# Patient Record
Sex: Female | Born: 1950 | Race: White | Hispanic: No | Marital: Married | Smoking: Former smoker
Health system: Southern US, Community
[De-identification: ages and names within clinical notes are randomized; demographics above are authoritative.]

## PROBLEM LIST (undated history)

## (undated) DIAGNOSIS — S82032A Displaced transverse fracture of left patella, initial encounter for closed fracture: Secondary | ICD-10-CM

## (undated) DIAGNOSIS — N823 Fistula of vagina to large intestine: Secondary | ICD-10-CM

## (undated) DIAGNOSIS — S52501A Unspecified fracture of the lower end of right radius, initial encounter for closed fracture: Secondary | ICD-10-CM

## (undated) DIAGNOSIS — M1990 Unspecified osteoarthritis, unspecified site: Secondary | ICD-10-CM

## (undated) DIAGNOSIS — D649 Anemia, unspecified: Secondary | ICD-10-CM

## (undated) DIAGNOSIS — K56609 Unspecified intestinal obstruction, unspecified as to partial versus complete obstruction: Secondary | ICD-10-CM

## (undated) DIAGNOSIS — K632 Fistula of intestine: Secondary | ICD-10-CM

## (undated) DIAGNOSIS — L03319 Cellulitis of trunk, unspecified: Secondary | ICD-10-CM

## (undated) DIAGNOSIS — Z5189 Encounter for other specified aftercare: Secondary | ICD-10-CM

## (undated) DIAGNOSIS — L02219 Cutaneous abscess of trunk, unspecified: Secondary | ICD-10-CM

## (undated) DIAGNOSIS — K56699 Other intestinal obstruction unspecified as to partial versus complete obstruction: Secondary | ICD-10-CM

## (undated) DIAGNOSIS — K50913 Crohn's disease, unspecified, with fistula: Secondary | ICD-10-CM

## (undated) DIAGNOSIS — K649 Unspecified hemorrhoids: Secondary | ICD-10-CM

## (undated) DIAGNOSIS — M810 Age-related osteoporosis without current pathological fracture: Secondary | ICD-10-CM

## (undated) DIAGNOSIS — K435 Parastomal hernia without obstruction or  gangrene: Secondary | ICD-10-CM

## (undated) DIAGNOSIS — N321 Vesicointestinal fistula: Secondary | ICD-10-CM

## (undated) DIAGNOSIS — K624 Stenosis of anus and rectum: Secondary | ICD-10-CM

## (undated) DIAGNOSIS — K50113 Crohn's disease of large intestine with fistula: Secondary | ICD-10-CM

## (undated) HISTORY — DX: Unspecified intestinal obstruction, unspecified as to partial versus complete obstruction: K56.609

## (undated) HISTORY — DX: Unspecified hemorrhoids: K64.9

## (undated) HISTORY — PX: BLADDER REPAIR: SHX76

## (undated) HISTORY — DX: Encounter for other specified aftercare: Z51.89

## (undated) HISTORY — PX: COLONOSCOPY: SHX174

## (undated) HISTORY — PX: TUBAL LIGATION: SHX77

## (undated) HISTORY — DX: Fistula of intestine: K63.2

## (undated) HISTORY — DX: Unspecified osteoarthritis, unspecified site: M19.90

## (undated) HISTORY — PX: APPENDECTOMY: SHX54

## (undated) HISTORY — DX: Crohn's disease, unspecified, with fistula: K50.913

## (undated) HISTORY — DX: Anemia, unspecified: D64.9

## (undated) HISTORY — DX: Parastomal hernia without obstruction or gangrene: K43.5

## (undated) HISTORY — DX: Age-related osteoporosis without current pathological fracture: M81.0

---
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
## 2000-11-28 ENCOUNTER — Encounter: Payer: Self-pay | Admitting: General Surgery

## 2000-11-28 ENCOUNTER — Encounter: Admission: RE | Admit: 2000-11-28 | Discharge: 2000-11-28 | Payer: Self-pay | Admitting: General Surgery

## 2000-12-18 ENCOUNTER — Encounter: Payer: Self-pay | Admitting: General Surgery

## 2000-12-18 ENCOUNTER — Encounter: Admission: RE | Admit: 2000-12-18 | Discharge: 2000-12-18 | Payer: Self-pay | Admitting: General Surgery

## 2000-12-26 ENCOUNTER — Other Ambulatory Visit: Admission: RE | Admit: 2000-12-26 | Discharge: 2000-12-26 | Payer: Self-pay | Admitting: *Deleted

## 2000-12-31 ENCOUNTER — Encounter: Payer: Self-pay | Admitting: *Deleted

## 2000-12-31 ENCOUNTER — Encounter: Admission: RE | Admit: 2000-12-31 | Discharge: 2000-12-31 | Payer: Self-pay | Admitting: *Deleted

## 2001-02-25 ENCOUNTER — Ambulatory Visit (HOSPITAL_COMMUNITY): Admission: RE | Admit: 2001-02-25 | Discharge: 2001-02-25 | Payer: Self-pay | Admitting: Gastroenterology

## 2001-02-25 ENCOUNTER — Encounter: Payer: Self-pay | Admitting: Gastroenterology

## 2001-03-11 ENCOUNTER — Encounter (INDEPENDENT_AMBULATORY_CARE_PROVIDER_SITE_OTHER): Payer: Self-pay

## 2001-03-11 ENCOUNTER — Other Ambulatory Visit: Admission: RE | Admit: 2001-03-11 | Discharge: 2001-03-11 | Payer: Self-pay | Admitting: Gastroenterology

## 2002-04-01 ENCOUNTER — Other Ambulatory Visit: Admission: RE | Admit: 2002-04-01 | Discharge: 2002-04-01 | Payer: Self-pay | Admitting: Obstetrics and Gynecology

## 2003-04-06 ENCOUNTER — Other Ambulatory Visit: Admission: RE | Admit: 2003-04-06 | Discharge: 2003-04-06 | Payer: Self-pay | Admitting: Obstetrics and Gynecology

## 2003-06-15 ENCOUNTER — Encounter: Admission: RE | Admit: 2003-06-15 | Discharge: 2003-06-15 | Payer: Self-pay | Admitting: Obstetrics and Gynecology

## 2004-06-07 ENCOUNTER — Ambulatory Visit: Payer: Self-pay | Admitting: Gastroenterology

## 2004-06-21 ENCOUNTER — Ambulatory Visit (HOSPITAL_COMMUNITY): Admission: RE | Admit: 2004-06-21 | Discharge: 2004-06-21 | Payer: Self-pay | Admitting: Gastroenterology

## 2004-06-28 ENCOUNTER — Ambulatory Visit: Payer: Self-pay | Admitting: Gastroenterology

## 2004-07-05 ENCOUNTER — Ambulatory Visit: Payer: Self-pay | Admitting: Gastroenterology

## 2004-07-12 ENCOUNTER — Ambulatory Visit: Payer: Self-pay | Admitting: Gastroenterology

## 2006-07-05 ENCOUNTER — Ambulatory Visit: Payer: Self-pay | Admitting: Gastroenterology

## 2006-07-05 LAB — CONVERTED CEMR LAB
AST: 23 units/L (ref 0–37)
Albumin: 3.3 g/dL — ABNORMAL LOW (ref 3.5–5.2)
Potassium: 3.8 meq/L (ref 3.5–5.1)
Rhuematoid fact SerPl-aCnc: <20 intl units/mL — ABNORMAL LOW (ref 0.0–20.0)
T4, Total: 6.8 ug/dL (ref 5.0–12.5)
Total Protein: 7.6 g/dL (ref 6.0–8.3)

## 2006-07-16 ENCOUNTER — Encounter: Payer: Self-pay | Admitting: Gastroenterology

## 2006-07-16 ENCOUNTER — Ambulatory Visit: Payer: Self-pay | Admitting: Gastroenterology

## 2006-07-18 ENCOUNTER — Ambulatory Visit: Payer: Self-pay | Admitting: Gastroenterology

## 2006-07-25 ENCOUNTER — Ambulatory Visit: Payer: Self-pay | Admitting: Gastroenterology

## 2006-08-02 ENCOUNTER — Ambulatory Visit: Payer: Self-pay | Admitting: Gastroenterology

## 2006-09-11 ENCOUNTER — Ambulatory Visit: Payer: Self-pay | Admitting: Gastroenterology

## 2006-09-11 LAB — CONVERTED CEMR LAB
HCT: 33.5 % — ABNORMAL LOW (ref 36.0–46.0)
Hemoglobin: 11.5 g/dL — ABNORMAL LOW (ref 12.0–15.0)
MCHC: 34.5 g/dL (ref 30.0–36.0)
MCV: 90.6 fL (ref 78.0–100.0)
Monocytes Relative: 12.2 % — ABNORMAL HIGH (ref 3.0–11.0)
Neutro Abs: 1.5 10*3/uL (ref 1.4–7.7)
Platelets: 328 10*3/uL (ref 150–400)
RBC: 3.69 M/uL — ABNORMAL LOW (ref 3.87–5.11)
Sed Rate: 29 mm/hr — ABNORMAL HIGH (ref 0–25)
WBC: 4.7 10*3/uL (ref 4.5–10.5)

## 2006-12-11 ENCOUNTER — Ambulatory Visit: Payer: Self-pay | Admitting: Gastroenterology

## 2006-12-11 LAB — CONVERTED CEMR LAB
BUN: 12 mg/dL (ref 6–23)
Calcium: 9.3 mg/dL (ref 8.4–10.5)
Chloride: 103 meq/L (ref 96–112)
GFR calc non Af Amer: 110 mL/min
HCT: 33.9 % — ABNORMAL LOW (ref 36.0–46.0)
Hemoglobin: 11.7 g/dL — ABNORMAL LOW (ref 12.0–15.0)
MCHC: 34.7 g/dL (ref 30.0–36.0)
Neutro Abs: 2.1 10*3/uL (ref 1.4–7.7)
Neutrophils Relative %: 40.5 % — ABNORMAL LOW (ref 43.0–77.0)
RBC: 3.75 M/uL — ABNORMAL LOW (ref 3.87–5.11)
Sed Rate: 55 mm/hr — ABNORMAL HIGH (ref 0–25)
Sodium: 137 meq/L (ref 135–145)
Vitamin B-12: 265 pg/mL (ref 211–911)

## 2006-12-13 ENCOUNTER — Encounter: Admission: RE | Admit: 2006-12-13 | Discharge: 2006-12-13 | Payer: Self-pay | Admitting: Obstetrics and Gynecology

## 2007-02-11 ENCOUNTER — Ambulatory Visit: Payer: Self-pay | Admitting: Surgery

## 2007-02-26 ENCOUNTER — Ambulatory Visit: Payer: Self-pay | Admitting: Gastroenterology

## 2007-02-26 LAB — CONVERTED CEMR LAB: Creatinine, Ser: 0.5 mg/dL (ref 0.4–1.2)

## 2007-02-27 ENCOUNTER — Other Ambulatory Visit: Payer: Self-pay | Admitting: Gastroenterology

## 2007-02-27 ENCOUNTER — Inpatient Hospital Stay (HOSPITAL_COMMUNITY): Admission: AD | Admit: 2007-02-27 | Discharge: 2007-03-11 | Payer: Self-pay | Admitting: Gastroenterology

## 2007-02-27 ENCOUNTER — Ambulatory Visit: Payer: Self-pay | Admitting: Cardiovascular Disease

## 2007-03-05 ENCOUNTER — Ambulatory Visit: Payer: Self-pay | Admitting: Internal Medicine

## 2007-03-06 ENCOUNTER — Encounter: Payer: Self-pay | Admitting: Internal Medicine

## 2007-03-07 ENCOUNTER — Ambulatory Visit: Payer: Self-pay | Admitting: Internal Medicine

## 2007-03-14 ENCOUNTER — Ambulatory Visit: Payer: Self-pay | Admitting: Gastroenterology

## 2007-03-21 ENCOUNTER — Ambulatory Visit (HOSPITAL_COMMUNITY): Admission: RE | Admit: 2007-03-21 | Discharge: 2007-03-21 | Payer: Self-pay | Admitting: Gastroenterology

## 2007-03-25 ENCOUNTER — Ambulatory Visit: Payer: Self-pay | Admitting: Gastroenterology

## 2007-03-25 LAB — CONVERTED CEMR LAB
Basophils Relative: 1 % (ref 0.0–1.0)
Eosinophils Absolute: 0.3 10*3/uL (ref 0.0–0.6)
Eosinophils Relative: 6.5 % — ABNORMAL HIGH (ref 0.0–5.0)
Lymphocytes Relative: 45.1 % (ref 12.0–46.0)
Monocytes Relative: 12.6 % — ABNORMAL HIGH (ref 3.0–11.0)
Neutrophils Relative %: 34.8 % — ABNORMAL LOW (ref 43.0–77.0)
Sed Rate: 49 mm/hr — ABNORMAL HIGH (ref 0–25)
WBC: 4.6 10*3/uL (ref 4.5–10.5)

## 2007-04-05 ENCOUNTER — Ambulatory Visit: Payer: Self-pay | Admitting: Internal Medicine

## 2007-05-29 ENCOUNTER — Ambulatory Visit: Payer: Self-pay | Admitting: Gastroenterology

## 2007-06-05 ENCOUNTER — Ambulatory Visit: Payer: Self-pay | Admitting: Cardiovascular Disease

## 2007-06-07 ENCOUNTER — Ambulatory Visit: Payer: Self-pay | Admitting: Gastroenterology

## 2007-06-07 LAB — CONVERTED CEMR LAB
CRP, High Sensitivity: 7 — ABNORMAL HIGH (ref 0.00–5.00)
Eosinophils Absolute: 0.1 10*3/uL (ref 0.0–0.6)
Eosinophils Relative: 2.5 % (ref 0.0–5.0)
HCT: 37.6 % (ref 36.0–46.0)
MCHC: 35.1 g/dL (ref 30.0–36.0)
Monocytes Relative: 12.3 % — ABNORMAL HIGH (ref 3.0–11.0)
Neutro Abs: 2.9 10*3/uL (ref 1.4–7.7)
Neutrophils Relative %: 53.4 % (ref 43.0–77.0)
RBC: 4.07 M/uL (ref 3.87–5.11)
Sed Rate: 60 mm/hr — ABNORMAL HIGH (ref 0–25)

## 2007-07-02 DIAGNOSIS — K649 Unspecified hemorrhoids: Secondary | ICD-10-CM | POA: Insufficient documentation

## 2007-07-02 DIAGNOSIS — K5090 Crohn's disease, unspecified, without complications: Secondary | ICD-10-CM

## 2007-07-02 DIAGNOSIS — K632 Fistula of intestine: Secondary | ICD-10-CM | POA: Insufficient documentation

## 2007-07-02 DIAGNOSIS — M810 Age-related osteoporosis without current pathological fracture: Secondary | ICD-10-CM | POA: Insufficient documentation

## 2007-07-02 DIAGNOSIS — K56609 Unspecified intestinal obstruction, unspecified as to partial versus complete obstruction: Secondary | ICD-10-CM | POA: Insufficient documentation

## 2007-09-24 ENCOUNTER — Ambulatory Visit: Payer: Self-pay | Admitting: Gastroenterology

## 2007-09-24 ENCOUNTER — Telehealth: Payer: Self-pay | Admitting: Gastroenterology

## 2007-09-24 LAB — CONVERTED CEMR LAB
BUN: 8 mg/dL (ref 6–23)
Basophils Absolute: 0.1 10*3/uL (ref 0.0–0.1)
Basophils Relative: 1.8 % — ABNORMAL HIGH (ref 0.0–1.0)
CO2: 30 meq/L (ref 19–32)
CRP, High Sensitivity: 4 (ref 0.00–5.00)
Chloride: 106 meq/L (ref 96–112)
Creatinine, Ser: 0.6 mg/dL (ref 0.4–1.2)
Eosinophils Relative: 3.2 % (ref 0.0–5.0)
Glucose, Bld: 90 mg/dL (ref 70–99)
HCT: 35.7 % — ABNORMAL LOW (ref 36.0–46.0)
MCHC: 34.3 g/dL (ref 30.0–36.0)
Potassium: 3.7 meq/L (ref 3.5–5.1)
RDW: 12.8 % (ref 11.5–14.6)
Sed Rate: 53 mm/hr — ABNORMAL HIGH (ref 0–22)
Sodium: 141 meq/L (ref 135–145)
WBC: 4.8 10*3/uL (ref 4.5–10.5)

## 2007-09-27 ENCOUNTER — Ambulatory Visit: Payer: Self-pay | Admitting: Cardiology

## 2007-10-03 ENCOUNTER — Telehealth: Payer: Self-pay | Admitting: Gastroenterology

## 2007-10-03 ENCOUNTER — Encounter: Payer: Self-pay | Admitting: Gastroenterology

## 2008-06-05 ENCOUNTER — Telehealth: Payer: Self-pay | Admitting: Gastroenterology

## 2008-07-28 ENCOUNTER — Ambulatory Visit: Payer: Self-pay | Admitting: Gastroenterology

## 2008-07-28 ENCOUNTER — Telehealth: Payer: Self-pay | Admitting: Gastroenterology

## 2008-07-29 ENCOUNTER — Telehealth: Payer: Self-pay | Admitting: Internal Medicine

## 2008-07-29 ENCOUNTER — Ambulatory Visit: Payer: Self-pay | Admitting: Cardiology

## 2008-07-29 LAB — CONVERTED CEMR LAB
BUN: 6 mg/dL (ref 6–23)
Basophils Relative: 0.4 % (ref 0.0–3.0)
Calcium: 9.2 mg/dL (ref 8.4–10.5)
Eosinophils Relative: 0.4 % (ref 0.0–5.0)
GFR calc non Af Amer: 108.92 mL/min (ref >60)
HCT: 35.3 % — ABNORMAL LOW (ref 36.0–46.0)
Lymphocytes Relative: 20.6 % (ref 12.0–46.0)
Platelets: 255 10*3/uL (ref 150.0–400.0)
RDW: 11.7 % (ref 11.5–14.6)

## 2008-07-30 ENCOUNTER — Ambulatory Visit: Payer: Self-pay | Admitting: Gastroenterology

## 2008-08-03 ENCOUNTER — Encounter: Payer: Self-pay | Admitting: Gastroenterology

## 2008-08-03 ENCOUNTER — Telehealth: Payer: Self-pay | Admitting: Gastroenterology

## 2008-08-04 ENCOUNTER — Encounter (INDEPENDENT_AMBULATORY_CARE_PROVIDER_SITE_OTHER): Payer: Self-pay | Admitting: *Deleted

## 2008-08-04 ENCOUNTER — Ambulatory Visit (HOSPITAL_COMMUNITY): Admission: RE | Admit: 2008-08-04 | Discharge: 2008-08-04 | Payer: Self-pay | Admitting: Obstetrics and Gynecology

## 2008-08-06 ENCOUNTER — Telehealth: Payer: Self-pay | Admitting: Gastroenterology

## 2008-08-10 ENCOUNTER — Ambulatory Visit: Payer: Self-pay | Admitting: Gastroenterology

## 2008-08-11 ENCOUNTER — Ambulatory Visit: Payer: Self-pay | Admitting: Gastroenterology

## 2008-08-11 DIAGNOSIS — L02219 Cutaneous abscess of trunk, unspecified: Secondary | ICD-10-CM

## 2008-08-11 DIAGNOSIS — L03319 Cellulitis of trunk, unspecified: Secondary | ICD-10-CM

## 2008-08-11 HISTORY — DX: Cutaneous abscess of trunk, unspecified: L02.219

## 2008-08-11 LAB — CONVERTED CEMR LAB
Basophils Relative: 0.6 % (ref 0.0–3.0)
CRP, High Sensitivity: 26 — ABNORMAL HIGH (ref 0.00–5.00)
Eosinophils Relative: 0.6 % (ref 0.0–5.0)
Hemoglobin: 12.4 g/dL (ref 12.0–15.0)
Lymphs Abs: 1.4 10*3/uL (ref 0.7–4.0)
Monocytes Absolute: 0.7 10*3/uL (ref 0.1–1.0)
Monocytes Relative: 16.8 % — ABNORMAL HIGH (ref 3.0–12.0)
Neutrophils Relative %: 49.7 % (ref 43.0–77.0)
RBC: 3.68 M/uL — ABNORMAL LOW (ref 3.87–5.11)
RDW: 12.1 % (ref 11.5–14.6)
Sed Rate: 67 mm/hr — ABNORMAL HIGH (ref 0–22)

## 2008-08-26 ENCOUNTER — Ambulatory Visit: Payer: Self-pay | Admitting: Gastroenterology

## 2008-09-01 ENCOUNTER — Telehealth: Payer: Self-pay | Admitting: Gastroenterology

## 2008-09-10 ENCOUNTER — Ambulatory Visit: Payer: Self-pay | Admitting: Gastroenterology

## 2008-09-10 LAB — CONVERTED CEMR LAB
Basophils Relative: 1.6 % (ref 0.0–3.0)
Eosinophils Absolute: 0.1 10*3/uL (ref 0.0–0.7)
Eosinophils Relative: 3.5 % (ref 0.0–5.0)
Lymphs Abs: 1.7 10*3/uL (ref 0.7–4.0)
Monocytes Absolute: 0.4 10*3/uL (ref 0.1–1.0)
Monocytes Relative: 11.5 % (ref 3.0–12.0)
Neutrophils Relative %: 37.4 % — ABNORMAL LOW (ref 43.0–77.0)
RBC: 3.67 M/uL — ABNORMAL LOW (ref 3.87–5.11)
RDW: 12.5 % (ref 11.5–14.6)
Sed Rate: 58 mm/hr — ABNORMAL HIGH (ref 0–22)
WBC: 3.6 10*3/uL — ABNORMAL LOW (ref 4.5–10.5)

## 2008-09-11 LAB — CONVERTED CEMR LAB
Basophils Absolute: 0 10*3/uL (ref 0.0–0.1)
Basophils Relative: 1.3 % (ref 0.0–3.0)
Eosinophils Absolute: 0.1 10*3/uL (ref 0.0–0.7)
Eosinophils Relative: 3.8 % (ref 0.0–5.0)
Ferritin: 115.1 ng/mL (ref 10.0–291.0)
HCT: 35.2 % — ABNORMAL LOW (ref 36.0–46.0)
MCHC: 35.3 g/dL (ref 30.0–36.0)
Monocytes Absolute: 0.4 10*3/uL (ref 0.1–1.0)
Monocytes Relative: 12.2 % — ABNORMAL HIGH (ref 3.0–12.0)
Neutro Abs: 1.4 10*3/uL (ref 1.4–7.7)
Neutrophils Relative %: 41.1 % — ABNORMAL LOW (ref 43.0–77.0)
Platelets: 250 10*3/uL (ref 150.0–400.0)

## 2008-10-30 ENCOUNTER — Telehealth: Payer: Self-pay | Admitting: Gastroenterology

## 2008-11-03 IMAGING — CT CT PELVIS W/ CM
2 of 5 series · 16 of 46 positions shown, 18 images · IV contrast (omnipaque)
Comparison: 06/21/04.

CLINICAL DATA: Abdominal pain with history of Crohn?s disease.  Epigastric and right upper quadrant pain with nausea, vomiting, and fever.  
ABDOMEN CT WITH CONTRAST:
TECHNIQUE: Multidetector CT imaging of the abdomen was performed following the standard protocol during bolus administration of intravenous contrast.
Contrast:  100 cc Omnipaque 300
TECHNIQUE: Multidetector CT imaging of the pelvis was performed following the standard protocol during bolus administration of intravenous contrast.

[Series 2: abd_pel 5.0 b30f st · axial · 0.73mm/px · z∈[-410,-40]mm · 13 of 84 slices shown, 15 images]
[im 5/84  soft-tissue]
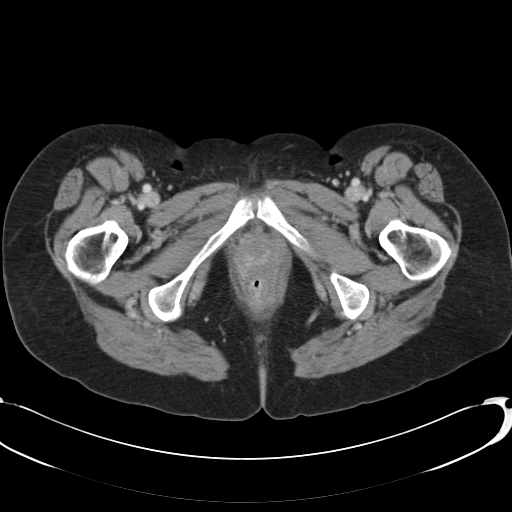
[im 5/84  bone]
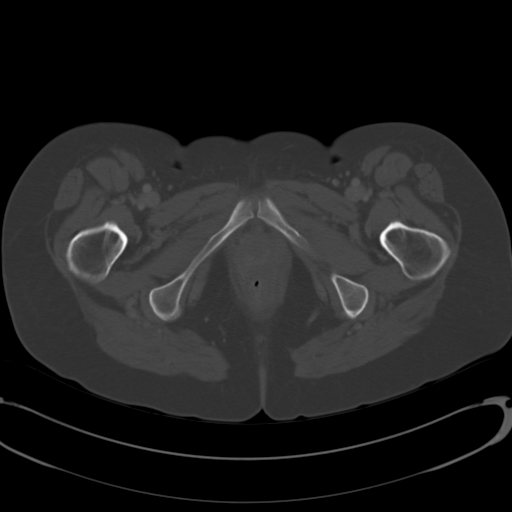
[im 10/84  soft-tissue]
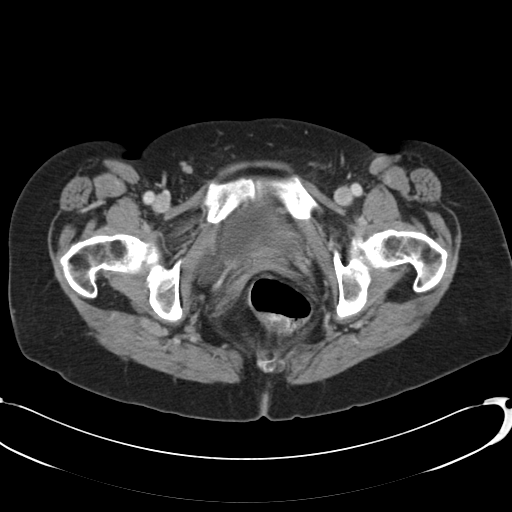
[im 19/84  soft-tissue]
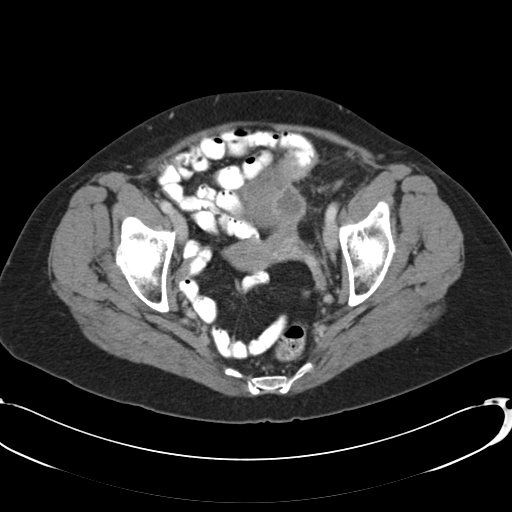
[im 24/84  soft-tissue]
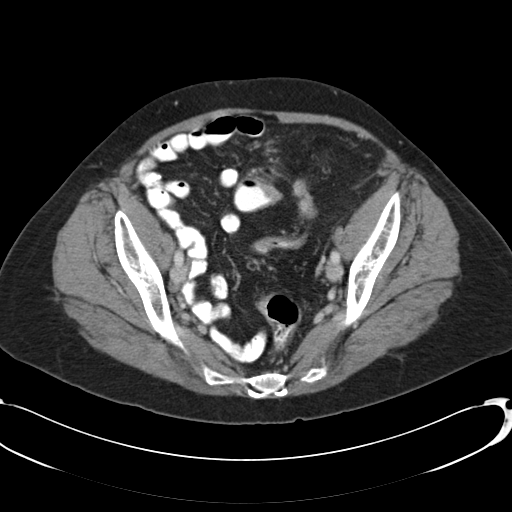
[im 28/84  soft-tissue]
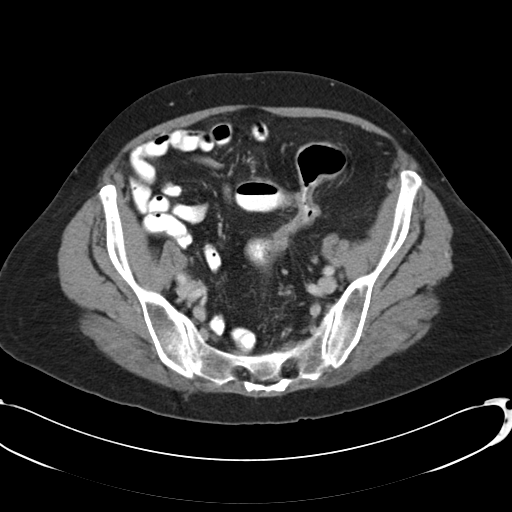
[im 37/84  soft-tissue]
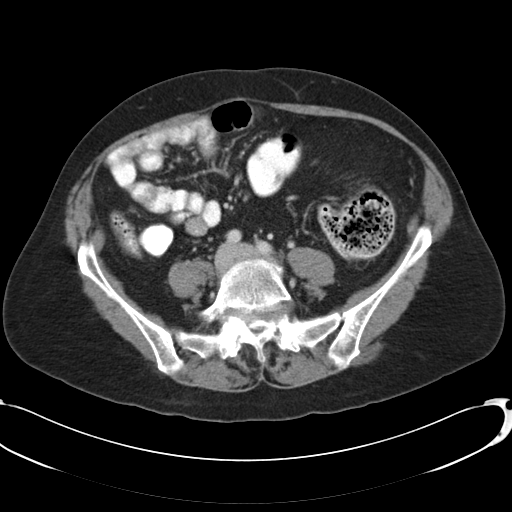
[im 42/84  soft-tissue]
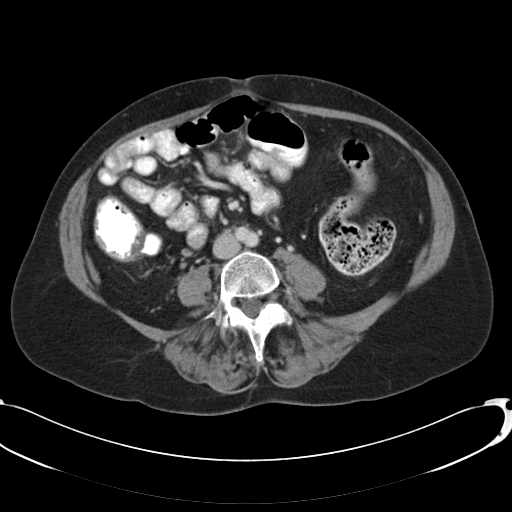
[im 47/84  soft-tissue]
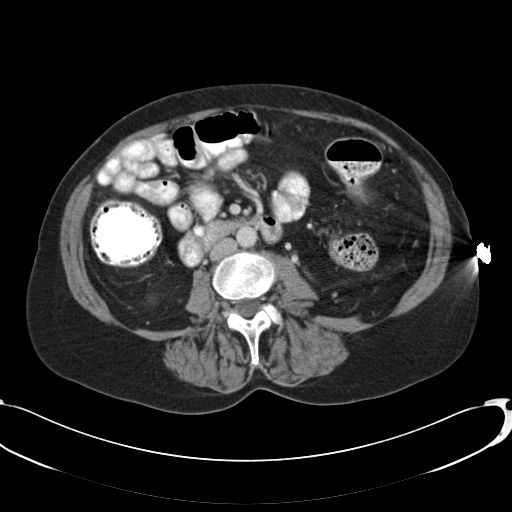
[im 56/84  soft-tissue]
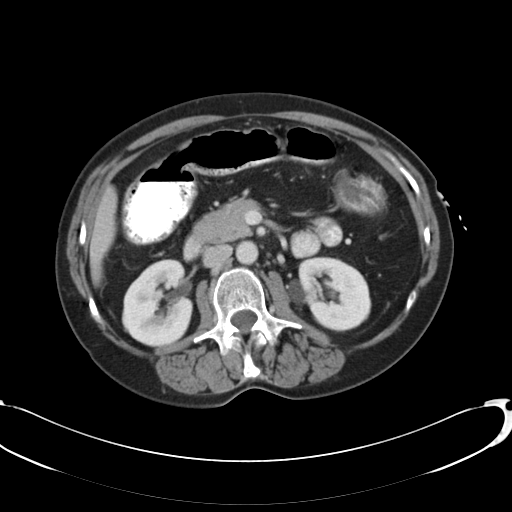
[im 56/84  bone]
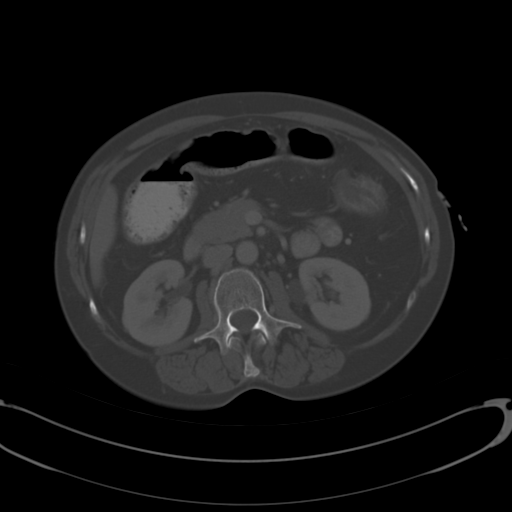
[im 60/84  soft-tissue]
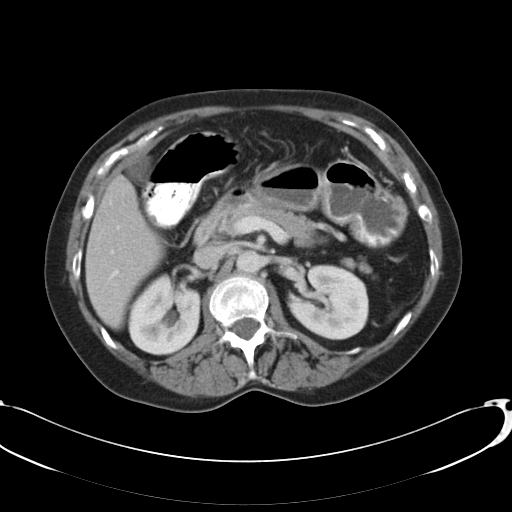
[im 65/84  soft-tissue]
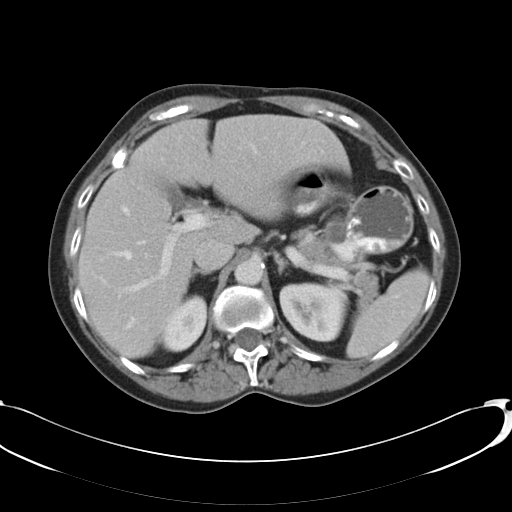
[im 74/84  soft-tissue]
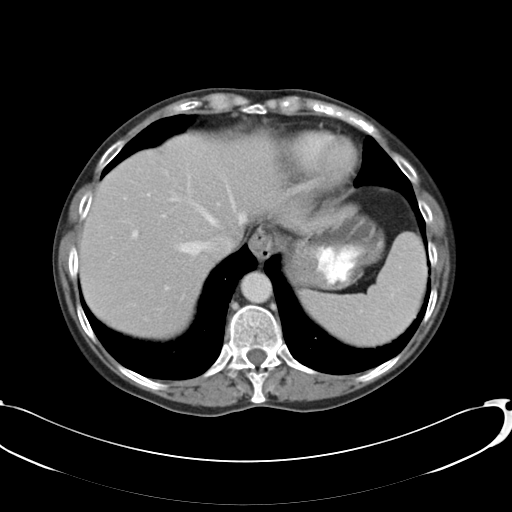
[im 79/84  soft-tissue]
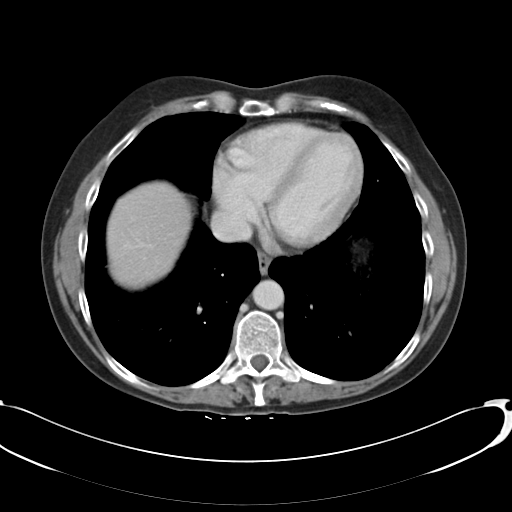

[Series 602: <mpr thick range> · coronal · 0.83mm/px · 3 of 76 slices shown]
[im 26/76  soft-tissue]
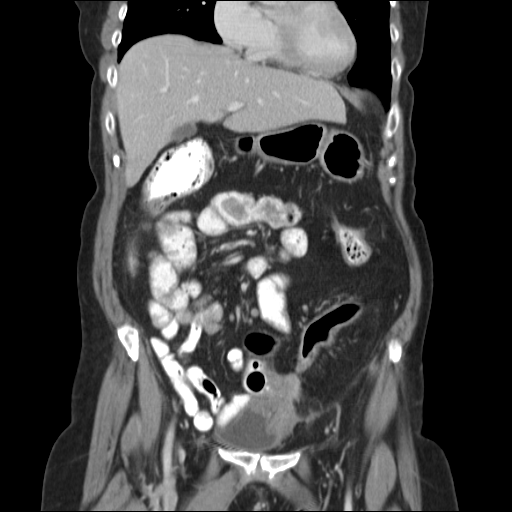
[im 34/76  soft-tissue]
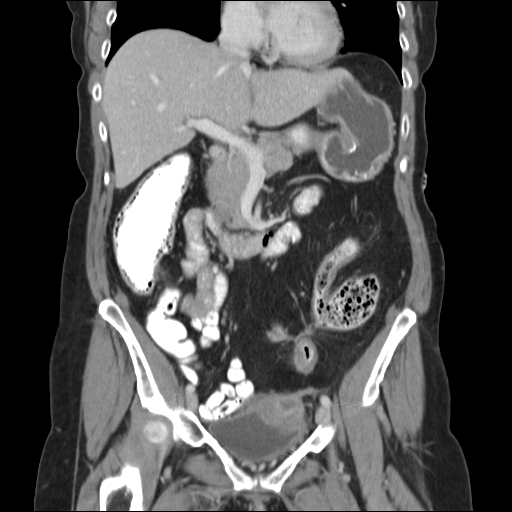
[im 42/76  soft-tissue]
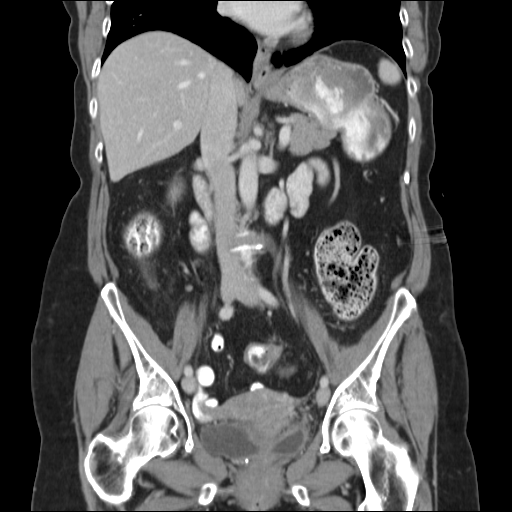

[16 of 46 positions shown; findings below may reference images not displayed]

FINDINGS: Subcentimeter low density lesions in the liver are unchanged and likely cysts or hemangiomas.  Gallbladder, adrenal glands, kidneys, spleen, and pancreas are unremarkable.  There is thickening of the gastric cardia.  Remainder of the stomach and proximal small bowel unremarkable.  Small bowel mesenteric lymph nodes do not meet CT size criteria for pathologic enlargement.
IMPRESSION: Question thickening of the gastric cardia.  Upper endoscopy may be helpful in further evaluation, as clinically indicated.
PELVIS CT WITH CONTRAST:
FINDINGS: Mild prominence of distal small bowel loops leading up to a narrowed segment of small bowel tethered to the sigmoid colon (image 56).  There may be contrast material contained within, suggesting a fistula.  Slightly more distally, within the same segment of small bowel, there is eccentric wall thickening which is contiguous with extensive soft tissue collection slightly more inferiorly.  Focal fluid collection is seen as well as with rim enhancement, measuring 2.3 x 2.9 cm.  Overall, the soft tissue and fluid collection measures approximately 3.7 x 6.0 cm and is contiguous with the left lateral aspect of the bladder.  Terminal ileum unremarkable.  Sigmoid colon is decompressed.  Remainder of colon is unremarkable.  No free fluid.  Uterus and ovaries are visualized.  No pathologically enlarged lymph nodes.  No worrisome lytic or sclerotic lesions.
IMPRESSION: 1.  Pelvic phlegmon and abscess adjacent to an eccentrically thickened loop of distal small bowel. 
2.  Suspect enterocolonic fistula, as above.
3.  These findings were discussed with Piter in Dr. Paulus N?[REDACTED] on 02/27/07 at [DATE] p.m.

## 2008-11-04 IMAGING — CR DG CHEST 1V PORT
1 series · 1 of 1 positions shown · non-contrast
Comparison: None.

CLINICAL DATA: Colonic abscess.  
 PORTABLE CHEST - 1 VIEW ? 02/28/07 AT 1100 HOURS:

[view not recorded]
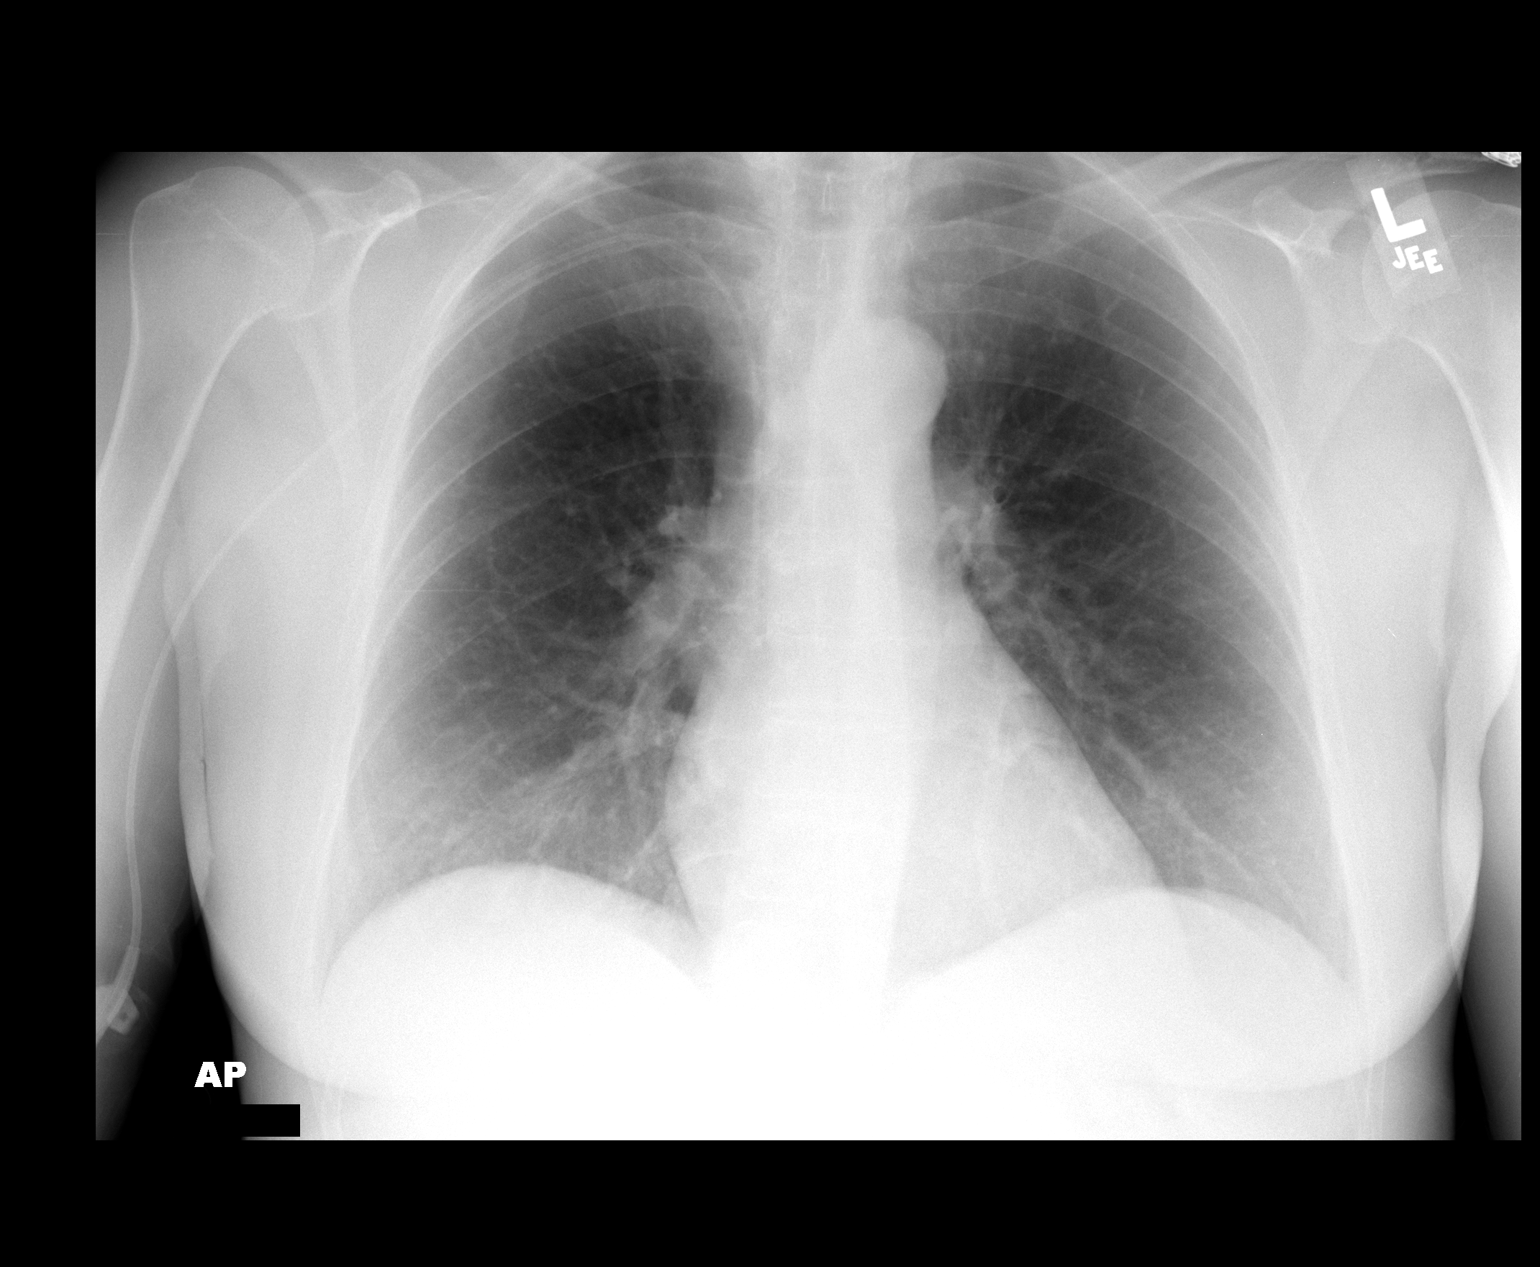

[1 of 1 positions shown; findings below may reference images not displayed]

FINDINGS: The heart is normal in size. The lungs are clear.  A PICC has been placed from the right upper extremity.  The tip is in the lower SVC.
IMPRESSION: PICC line in the lower SVC.  No acute cardiopulmonary disease.

## 2008-11-20 ENCOUNTER — Telehealth: Payer: Self-pay | Admitting: Gastroenterology

## 2008-11-23 ENCOUNTER — Telehealth: Payer: Self-pay | Admitting: Gastroenterology

## 2008-11-23 ENCOUNTER — Ambulatory Visit: Payer: Self-pay | Admitting: Gastroenterology

## 2008-11-23 LAB — CONVERTED CEMR LAB
ALT: 18 units/L (ref 0–35)
Albumin: 3.6 g/dL (ref 3.5–5.2)
BUN: 6 mg/dL (ref 6–23)
Bilirubin Urine: NEGATIVE
Bilirubin, Direct: 0.2 mg/dL (ref 0.0–0.3)
CO2: 30 meq/L (ref 19–32)
Calcium: 9.2 mg/dL (ref 8.4–10.5)
Chloride: 95 meq/L — ABNORMAL LOW (ref 96–112)
Creatinine, Ser: 0.7 mg/dL (ref 0.4–1.2)
Eosinophils Absolute: 0 10*3/uL (ref 0.0–0.7)
Glucose, Bld: 94 mg/dL (ref 70–99)
MCV: 96.3 fL (ref 78.0–100.0)
Monocytes Absolute: 0.6 10*3/uL (ref 0.1–1.0)
Potassium: 3.4 meq/L — ABNORMAL LOW (ref 3.5–5.1)
RBC: 3.56 M/uL — ABNORMAL LOW (ref 3.87–5.11)
Sodium: 134 meq/L — ABNORMAL LOW (ref 135–145)
Specific Gravity, Urine: <=1.005 (ref 1.000–1.030)
TSH: 1.92 microintl units/mL (ref 0.35–5.50)
Total Protein, Urine: NEGATIVE mg/dL
Total Protein: 9.1 g/dL — ABNORMAL HIGH (ref 6.0–8.3)
Urobilinogen, UA: 0.2 (ref 0.0–1.0)
pH: 6.5 (ref 5.0–8.0)

## 2008-11-24 ENCOUNTER — Ambulatory Visit: Payer: Self-pay | Admitting: Gastroenterology

## 2008-11-25 ENCOUNTER — Telehealth: Payer: Self-pay | Admitting: Gastroenterology

## 2008-11-25 ENCOUNTER — Ambulatory Visit: Payer: Self-pay | Admitting: Internal Medicine

## 2008-11-25 ENCOUNTER — Inpatient Hospital Stay (HOSPITAL_COMMUNITY): Admission: AD | Admit: 2008-11-25 | Discharge: 2008-11-28 | Payer: Self-pay | Admitting: Internal Medicine

## 2008-11-26 ENCOUNTER — Encounter: Payer: Self-pay | Admitting: Internal Medicine

## 2008-11-26 ENCOUNTER — Encounter: Payer: Self-pay | Admitting: Gastroenterology

## 2008-11-27 ENCOUNTER — Ambulatory Visit: Payer: Self-pay | Admitting: Infectious Diseases

## 2008-12-03 ENCOUNTER — Telehealth: Payer: Self-pay | Admitting: Gastroenterology

## 2008-12-15 ENCOUNTER — Telehealth: Payer: Self-pay | Admitting: Gastroenterology

## 2008-12-16 ENCOUNTER — Ambulatory Visit: Payer: Self-pay | Admitting: Gastroenterology

## 2008-12-29 ENCOUNTER — Ambulatory Visit: Payer: Self-pay | Admitting: Gastroenterology

## 2009-01-28 ENCOUNTER — Encounter: Payer: Self-pay | Admitting: Gastroenterology

## 2009-02-03 ENCOUNTER — Telehealth: Payer: Self-pay | Admitting: Gastroenterology

## 2009-05-20 ENCOUNTER — Telehealth: Payer: Self-pay | Admitting: Gastroenterology

## 2009-05-21 ENCOUNTER — Telehealth: Payer: Self-pay | Admitting: Gastroenterology

## 2009-05-21 ENCOUNTER — Ambulatory Visit: Payer: Self-pay | Admitting: Gastroenterology

## 2009-07-08 ENCOUNTER — Ambulatory Visit: Payer: Self-pay | Admitting: Cardiology

## 2009-07-08 ENCOUNTER — Ambulatory Visit: Payer: Self-pay | Admitting: Gastroenterology

## 2009-07-08 ENCOUNTER — Telehealth: Payer: Self-pay | Admitting: Gastroenterology

## 2009-07-08 LAB — CONVERTED CEMR LAB
BUN: 4 mg/dL — ABNORMAL LOW (ref 6–23)
Creatinine, Ser: 0.6 mg/dL (ref 0.4–1.2)

## 2009-07-13 ENCOUNTER — Ambulatory Visit: Payer: Self-pay | Admitting: Gastroenterology

## 2009-07-13 DIAGNOSIS — R930 Abnormal findings on diagnostic imaging of skull and head, not elsewhere classified: Secondary | ICD-10-CM | POA: Insufficient documentation

## 2009-07-21 ENCOUNTER — Ambulatory Visit: Payer: Self-pay | Admitting: Gastroenterology

## 2009-07-21 LAB — CONVERTED CEMR LAB
AST: 25 units/L (ref 0–37)
BUN: 7 mg/dL (ref 6–23)
Chloride: 106 meq/L (ref 96–112)
Creatinine, Ser: 0.6 mg/dL (ref 0.4–1.2)
Eosinophils Relative: 4.2 % (ref 0.0–5.0)
GFR calc non Af Amer: 108.55 mL/min (ref >60)
Iron: 47 ug/dL (ref 42–145)
Lymphocytes Relative: 28.2 % (ref 12.0–46.0)
Lymphs Abs: 1.1 10*3/uL (ref 0.7–4.0)
MCV: 97.8 fL (ref 78.0–100.0)
Monocytes Absolute: 0.3 10*3/uL (ref 0.1–1.0)
Monocytes Relative: 8.1 % (ref 3.0–12.0)
Platelets: 293 10*3/uL (ref 150.0–400.0)
Potassium: 3.7 meq/L (ref 3.5–5.1)
RDW: 12.4 % (ref 11.5–14.6)
Saturation Ratios: 11.5 % — ABNORMAL LOW (ref 20.0–50.0)
Sed Rate: 62 mm/hr — ABNORMAL HIGH (ref 0–22)
TSH: 2.65 microintl units/mL (ref 0.35–5.50)
Total Bilirubin: 0.5 mg/dL (ref 0.3–1.2)
Total Protein: 8.3 g/dL (ref 6.0–8.3)
WBC: 3.8 10*3/uL — ABNORMAL LOW (ref 4.5–10.5)

## 2009-11-19 ENCOUNTER — Telehealth: Payer: Self-pay | Admitting: Gastroenterology

## 2010-06-06 ENCOUNTER — Encounter: Payer: Self-pay | Admitting: Gastroenterology

## 2010-06-16 NOTE — Assessment & Plan Note (Signed)
Summary: Abd absess/dfs   History of Present Illness Visit Type: Follow-up Visit Primary GI MD: Verl Blalock MD Primary Provider: N/A Requesting Provider: n/a Chief Complaint: Abnormal CT, diarrhea & hemorrhoids History of Present Illness:   Lauren Figueroa has been off Humira for 10 months he been doing well until last week when she developed lower abdominal pressure, bladder urgency, and general malaise. 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 She was placed on Avelox 400 mg a day for 5 days and is currently asymptomatic. She has occasional loose stool but denies rectal bleeding, fever, chills, or other systemic complaints. She is followed surgically by Dr. Alphonsa Overall. She has recurrent hemorrhoidal problems responsive to local anal mantle cream. Her CT scan suggested pneumonia, and she denies any pulmonary complaints and apparently was exposed to paint fumes about the time this all occurred. She has refused treatment for B12 deficiency.   GI Review of Systems    Reports abdominal pain and  nausea.     Location of  Abdominal pain: upper abdomen.    Denies acid reflux, belching, bloating, chest pain, dysphagia with liquids, dysphagia with solids, heartburn, loss of appetite, vomiting, vomiting blood, weight loss, and  weight gain.      Reports diarrhea and  hemorrhoids.     Denies anal fissure, black tarry stools, change in bowel habit, constipation, diverticulosis, fecal incontinence, heme positive stool, irritable bowel syndrome, jaundice, light color stool, liver problems, rectal bleeding, and  rectal pain.    Current Medications (verified): 1)  Analpram-Hc 1-2.5 % Crea (Hydrocortisone Ace-Pramoxine) .... Apply A Small Amount To Affected Area As Needed 2)  Multivitamins   Tabs (Multiple Vitamin) .... One Tablet By Mouth Once Daily 3)  Avelox 400  Mg Tabs (Moxifloxacin Hcl) .Marland Kitchen.. 1 By Mouth Daily  Allergies (verified): 1)  ! Penicillin 2)  ! Flagyl 3)  Cipro (Ciprofloxacin Hcl)  Past History:  Past medical, surgical, family and social histories (including risk factors) reviewed for relevance to current acute and chronic problems.  Past Medical History: Reviewed history from 08/10/2008 and no changes required. 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 Family History of Diabetes: grandparents/parents Family History of Heart Disease: father lung cancer/mother No FH of Colon Cancer:  Social History: Reviewed history from 12/29/2008 and no changes required. Patient is a former smoker.  Alcohol Use - yes Daily Caffeine Use coffee in the AM Illicit Drug Use - no Occupation: Research officer, trade union Married  1 girl 1 boy  Review of Systems       The patient complains of arthritis/joint pain, cough, fever, sleeping problems, and urination changes/pain.  The patient denies allergy/sinus, anemia, anxiety-new, back pain, blood in urine, breast changes/lumps, change in vision, confusion, coughing up blood, depression-new, fainting, fatigue, headaches-new, hearing problems, heart murmur, heart rhythm changes, itching, menstrual pain, muscle pains/cramps, nosebleeds, pregnancy symptoms, shortness of breath, skin rash, sore throat, swelling of feet/legs, swollen lymph glands, thirst - excessive , urination - excessive , urine leakage, vision changes, and voice change.   General:  Complains of fatigue; denies fever, chills, sweats, anorexia, weakness, malaise, weight loss, and  sleep disorder. ENT:  Denies  earache, ear discharge, tinnitus, decreased hearing, nasal congestion, loss of smell, nosebleeds, sore throat, hoarseness, and difficulty swallowing. CV:  Denies chest pains, angina, palpitations, syncope, dyspnea on exertion, orthopnea, PND, peripheral edema, and claudication. Resp:  Denies dyspnea at rest, dyspnea with exercise, cough, sputum, wheezing, coughing up blood, and pleurisy. GU:  Denies urinary burning, blood in urine, nocturnal urination, urinary frequency, urinary incontinence, abnormal vaginal bleeding, amenorrhea, menorrhagia, vaginal discharge, pelvic pain, genital sores, painful intercourse, and decreased libido; She experiences bladder pressure but denies pneumaturia, hematuria, or pyuria. Serum BUN and creatinine were normal.. MS:  Complains of joint stiffness; denies joint pain / LOM, joint swelling, joint deformity, low back pain, muscle weakness, muscle cramps, muscle atrophy, leg pain at night, leg pain with exertion, and shoulder pain / LOM hand / wrist pain (CTS). Derm:  Denies rash, itching, dry skin, hives, moles, warts, and unhealing ulcers. Psych:  Denies depression, anxiety, memory loss, suicidal ideation, hallucinations, paranoia, phobia, and confusion. Endo:  Complains of cold intolerance; denies heat intolerance, polydipsia, polyphagia, polyuria, unusual weight change, and hirsutism. Heme:  Denies bruising, bleeding, enlarged lymph nodes, and pagophagia. Allergy:  Denies hives, rash, sneezing, hay fever, and recurrent infections.  Vital Signs:  Patient profile:   60 year old female Height:      54 inches Weight:      132.38 pounds BMI:     32.03 Pulse rate:   72 / minute Pulse rhythm:   regular BP sitting:   130 / 78  (left arm) Cuff size:   regular  Vitals Entered By: June McMurray Nipomo Deborra Medina) (July 13, 2009 3:36 PM)  Physical Exam  General:  Well developed, well nourished, no acute distress.She appears healthy and in no acute distress from previous  exams. Head:  Normocephalic and atraumatic. Eyes:  PERRLA, no icterus.exam deferred to patient's ophthalmologist.   Lungs:  Clear throughout to auscultation.Close auscultation she has no wheezes, rales, or rhonchi. There also are no areas of consolidation noted. Heart:  Regular rate and rhythm; no murmurs, rubs,  or bruits. Abdomen:  well-healed lower nominal scar is noted with mild distention but very active bowel sounds. There is no organomegaly, but there is fullness in the left lower quadrant area as previously noted with minimal tenderness. Rectal:  refused Pulses:  Normal pulses noted. Extremities:  No clubbing, cyanosis, edema or deformities noted. Neurologic:  Alert and  oriented x4;  grossly normal neurologically. Skin:  Intact without significant lesions or rashes. Inguinal Nodes:  No significant inguinal adenopathy. Psych:  Alert and cooperative. Normal mood and affect.   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 She currently is asymptomatic after taking broad-spectrum antibiotics. I will stop her Avelox and place her on Florstar probiotic therapy and repeat all her labs next week. She did receive a B12 shot today. I have sent her CT scan for review by Dr. Lucia Gaskins. Orders: Vit B12 1000 mcg (E3154)  Problem # 2:  HEMORRHOIDS (ICD-455.6) Assessment: Unchanged continued local anal tear and anal mantle cream.  Problem # 3:  OSTEOPOROSIS (ICD-733.00) Assessment: Unchanged Patient for reasons that are unclear will not take calcium or vitamin D. She is not on corticosteroids, biological, wean as suppressive therapy at this time.Repeat Labs have been ordered for next week including urinalysis and anemia profile, CBC,  liver profile, and sedimentation rate.  Problem # 4:  ABNORMAL CHEST XRAY  (GYI-948.1) Assessment: Unchanged She probably did have a transient chemical pneumonitis. She still has no symptoms of bacterial pneumonitis is not on immunosuppressive therapy. She does have a history of her father having lung cancer, she previously was a smoker, and we will repeat her chest x-ray next week.  Patient Instructions: 1)  Copy sent to : Dr. Alphonsa Overall 2)  Please continue current medications.  3)  Labs chest x-ray next week and urinalysis 4)  Stop Avelox 5)  Please schedule a follow-up appointment as needed.  6)  B12 administered 7)  The medication list was reviewed and reconciled.  All changed / newly prescribed medications were explained.  A complete medication list was provided to the patient / caregiver.   Medication Administration  Injection # 1:    Medication: Vit B12 1000 mcg    Diagnosis: CROHN'S DISEASE (ICD-555.9)    Route: IM    Site: L deltoid    Exp Date: 11/12    Lot #: [id number]    Mfr: American Regent    Patient tolerated injection without complications    Given by: Alberteen Spindle RN (July 13, 2009 4:27 PM)  Orders Added: 1)  Vit B12 1000 mcg [J3420]  Appended Document: Abd absess/dfs    Clinical Lists Changes  Orders: Added new Test order of T-2 View CXR ([id number]) - Signed

## 2010-06-16 NOTE — Progress Notes (Signed)
Summary: triage  Phone Note Call from Patient Call back at [phone]   Caller: Patient Call For: Sharlett Iles Reason for Call: Talk to Doctor Summary of Call: Patient wants to speak to nurse regarding possible abcess and wanting a ct scan done  Initial call taken by: Ronalee Red,  July 08, 2009 8:24 AM  Follow-up for Phone Call        Pt is having lower abd pain,  Alot of pressure.  Has alot of pain when she urinates.  Symptoms are same as when she had a pelvic absess.   Pt is asking if she should have a CT scan done?  Concerned since is is close to the weekend.   No temp. Follow-up by: Alberteen Spindle RN,  July 08, 2009 8:42 AM  Additional Follow-up for Phone Call Additional follow up Details #1::        yes Additional Follow-up by: Sable Feil MD Marval Regal,  July 08, 2009 8:53 AM    Additional Follow-up for Phone Call Additional follow up Details #2::    CT scheduled at [facility] for 3pm today.  Pt instructed nothing to eat/drink after 11am.  She will come to our office to pick up contrast which she has been instructed to begin drinking at 1pm.  She will go to lab for stat labs.  Labs entered into ID[facility].  Order entered and given to [place] to precert. Follow-up by: Abelino Derrick CMA Deborra Medina),  July 08, 2009 9:39 AM

## 2010-06-16 NOTE — Assessment & Plan Note (Signed)
Summary: FLU Rolm Baptise  Nurse Visit   Allergies: 1)  ! Penicillin 2)  Cipro (Ciprofloxacin Hcl)  Orders Added: 1)  Admin 1st Vaccine [90471] 2)  Flu Vaccine 41yr + [[87564]Flu Vaccine Consent Questions     Do you have a history of severe allergic reactions to this vaccine? no    Any prior history of allergic reactions to egg and/or gelatin? no    Do you have a sensitivity to the preservative Thimersol? no    Do you have a past history of Guillan-Barre Syndrome? no    Do you currently have an acute febrile illness? no    Have you ever had a severe reaction to latex? no    Vaccine information given and explained to patient? yes    Are you currently pregnant? no    Lot Number:[id number]   Exp Date:11/11/2009   Site Given  Left Deltoid IM .lbflu

## 2010-06-16 NOTE — Progress Notes (Signed)
Summary: Triage  Phone Note Call from Patient Call back at Work Phone [phone]   Caller: Patient Call For: Dr. Sharlett Iles Reason for Call: Talk to Nurse Summary of Call: Pt wants to know if she can get a flu shot scheduled. Initial call taken by: Webb Laws,  May 21, 2009 8:59 AM  Follow-up for Phone Call        OK for pt to get flu shot.  please schedule nurse visit at pt's convience for flu vaccine.    Follow-up by: Alberteen Spindle RN,  May 21, 2009 9:04 AM  Additional Follow-up for Phone Call Additional follow up Details #1::        Pt. will come at 4pm today for a flu shot. Pt. instructed to call back as needed.  Additional Follow-up by: Vivia Ewing LPN,  May 21, 1980 11:05 AM

## 2010-06-16 NOTE — Progress Notes (Signed)
Summary: Refill  Phone Note From Pharmacy   Caller: [facility]* Summary of Call: Refill requested for Analpram. Initial call taken by: Alberteen Spindle RN,  November 19, 2009 3:38 PM    Prescriptions: ANALPRAM-HC 1-2.5 % CREA (HYDROCORTISONE ACE-PRAMOXINE) Apply a small amount to affected area as needed  #30 x 1   Entered by:   Alberteen Spindle RN   Authorized by:   Sable Feil MD [facility]   Signed by:   Alberteen Spindle RN on 11/19/2009   Method used:   Electronically to        [facility] (retail)       [address] [place], [place]  [id number]       Ph: [phone]       Fax: [phone]   RxID:   [id number]

## 2010-06-16 NOTE — Progress Notes (Signed)
Summary: Refill request Analpram  Phone Note From Pharmacy   Summary of Call: Refill requested for Analpram cream from [facility] Initial call taken by: Alberteen Spindle RN,  May 20, 2009 4:16 PM    Prescriptions: ANALPRAM-HC 1-2.5 % CREA (HYDROCORTISONE ACE-PRAMOXINE) Apply a small amount to affected area as needed  #30 x 1   Entered by:   Alberteen Spindle RN   Authorized by:   Sable Feil MD [facility]   Signed by:   Alberteen Spindle RN on 05/20/2009   Method used:   Electronically to        [facility] (retail)       [address] [place], [place]  [id number]       Ph: [phone]       Fax: [phone]   RxID:   [id number]

## 2010-08-21 LAB — BASIC METABOLIC PANEL
BUN: <1 mg/dL — ABNORMAL LOW (ref 6–23)
CO2: 25 mEq/L (ref 19–32)
Calcium: 8.6 mg/dL (ref 8.4–10.5)
Chloride: 112 mEq/L (ref 96–112)
Chloride: 106 mEq/L (ref 96–112)
Creatinine, Ser: 0.65 mg/dL (ref 0.4–1.2)
GFR calc Af Amer: >60 mL/min (ref >60)
Glucose, Bld: 109 mg/dL — ABNORMAL HIGH (ref 70–99)
Potassium: 4 mEq/L (ref 3.5–5.1)

## 2010-08-21 LAB — CULTURE, BLOOD (ROUTINE X 2): Culture: NO GROWTH

## 2010-08-21 LAB — CBC
HCT: 31.2 % — ABNORMAL LOW (ref 36.0–46.0)
MCV: 95.6 fL (ref 78.0–100.0)
Platelets: 238 10*3/uL (ref 150–400)
RDW: 13.2 % (ref 11.5–15.5)
WBC: 7.7 10*3/uL (ref 4.0–10.5)

## 2010-08-21 LAB — COMPREHENSIVE METABOLIC PANEL
Albumin: 3.1 g/dL — ABNORMAL LOW (ref 3.5–5.2)
BUN: 4 mg/dL — ABNORMAL LOW (ref 6–23)
Chloride: 100 mEq/L (ref 96–112)
Creatinine, Ser: 0.68 mg/dL (ref 0.4–1.2)
Total Bilirubin: 0.9 mg/dL (ref 0.3–1.2)

## 2010-08-21 LAB — CLOSTRIDIUM DIFFICILE EIA

## 2010-08-25 LAB — CBC
HCT: 37.5 % (ref 36.0–46.0)
Hemoglobin: 13.1 g/dL (ref 12.0–15.0)
MCHC: 34.9 g/dL (ref 30.0–36.0)
MCV: 97.8 fL (ref 78.0–100.0)
Platelets: 316 K/uL (ref 150–400)
RBC: 3.84 MIL/uL — ABNORMAL LOW (ref 3.87–5.11)
RDW: 12.7 % (ref 11.5–15.5)
WBC: 3.6 K/uL — ABNORMAL LOW (ref 4.0–10.5)

## 2010-08-25 LAB — ANAEROBIC CULTURE

## 2010-08-25 LAB — CULTURE, ROUTINE-ABSCESS: Culture: NO GROWTH

## 2010-08-25 LAB — PROTIME-INR
INR: 1.1 (ref 0.00–1.49)
Prothrombin Time: 14.1 s (ref 11.6–15.2)

## 2010-09-27 ENCOUNTER — Other Ambulatory Visit (INDEPENDENT_AMBULATORY_CARE_PROVIDER_SITE_OTHER): Payer: BC Managed Care – PPO

## 2010-09-27 ENCOUNTER — Ambulatory Visit (INDEPENDENT_AMBULATORY_CARE_PROVIDER_SITE_OTHER): Payer: BC Managed Care – PPO | Admitting: Gastroenterology

## 2010-09-27 ENCOUNTER — Encounter: Payer: Self-pay | Admitting: Gastroenterology

## 2010-09-27 VITALS — BP 150/84 | HR 76 | Ht 63.0 in | Wt 133.8 lb

## 2010-09-27 DIAGNOSIS — K603 Anal fistula, unspecified: Secondary | ICD-10-CM

## 2010-09-27 DIAGNOSIS — K5090 Crohn's disease, unspecified, without complications: Secondary | ICD-10-CM

## 2010-09-27 LAB — BASIC METABOLIC PANEL
CO2: 28 mEq/L (ref 19–32)
GFR: 136.58 mL/min (ref >60.00)
Glucose, Bld: 83 mg/dL (ref 70–99)
Potassium: 3.8 mEq/L (ref 3.5–5.1)
Sodium: 137 mEq/L (ref 135–145)

## 2010-09-27 LAB — HEPATIC FUNCTION PANEL
ALT: 16 U/L (ref 0–35)
Alkaline Phosphatase: 106 U/L (ref 39–117)
Bilirubin, Direct: 0.1 mg/dL (ref 0.0–0.3)
Total Protein: 7.7 g/dL (ref 6.0–8.3)

## 2010-09-27 LAB — VITAMIN B12: Vitamin B-12: 293 pg/mL (ref 211–911)

## 2010-09-27 LAB — CBC WITH DIFFERENTIAL/PLATELET
Basophils Relative: 1.7 % (ref 0.0–3.0)
Eosinophils Absolute: 0.1 10*3/uL (ref 0.0–0.7)
HCT: 33.3 % — ABNORMAL LOW (ref 36.0–46.0)
Lymphs Abs: 1.4 10*3/uL (ref 0.7–4.0)
MCHC: 35.2 g/dL (ref 30.0–36.0)
MCV: 94 fl (ref 78.0–100.0)
Monocytes Absolute: 0.4 10*3/uL (ref 0.1–1.0)
Neutro Abs: 2.8 10*3/uL (ref 1.4–7.7)
Neutrophils Relative %: 57.1 % (ref 43.0–77.0)
RBC: 3.55 Mil/uL — ABNORMAL LOW (ref 3.87–5.11)

## 2010-09-27 NOTE — Consult Note (Signed)
NAME:  Lauren Figueroa, PALA NO.:  [id number]   MEDICAL RECORD NO.:  [id number]          PATIENT TYPE:  INP   LOCATION:  1342                         FACILITY:  [facility]   PHYSICIAN:  Fenton Malling. Lucia Gaskins, M.D.  DATE OF BIRTH:  Dec 13, 1950   DATE OF CONSULTATION:  11/27/2008  DATE OF DISCHARGE:  11/28/2008                                 CONSULTATION   Date of consultation - 27 November 2008   HISTORY OF PRESENT ILLNESS:  Lauren Figueroa is a pleasant 60 year old  white female who is a patient of Lauren Figueroa.  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  I think I initially saw her in October 2008 and  over the last couple of years about every six months she has flared up  with an abscess which has sometimes required hospitalization.  Most  recently in March 2010 she had a focal abscess above her bladder on the  left.  This actually underwent an aspiration by Lauren Figueroa.  This did  well.  However, the last couple months, she has had more trouble and Dr.  Sharlett Figueroa has treated her with antibiotics.  Then most recently this  past weekend, which would be July 11/12, 2010, she developed fever,  chills, dry heaves, had a little bit of diarrhea.  Had been on some  Levaquin which seemed to help some but then she was hospitalized on July  14 for her symptoms.   A repeat CT scan was obtained on July 15.  This showed a 3 x 4.5 cm  abscess in the central pelvis, a little bit to the left of the bladder.  There appeared to be a fistula from the sigmoid colon extending to the  abscess and may be even a fistula between the small bowel and the colon.  There is some thickening of the left colon, maybe moreso than all prior  films.  The films have a very similar appearance and anatomic  architecture to multiple previous CT scans she has had over the last 2  years.   In talking with Lauren Figueroa, she says when she got on the  antibiotics  thinking to clear up, she is feeling much better now.  She has had no  nausea or vomiting since hospitalization.  Her bowels are getting  better.  She has no localized tenderness and she is actually anxious to  try to get home.   ALLERGIES:  CIPRO, PENICILLIN, FLAGYL.   MEDICATIONS:  1. Humira 40 mg every two weeks.  2. Analpram cream.  3. She takes some vitamins.   REVIEW OF SYSTEMS:  Otherwise actually unremarkable.  PULMONARY:  She has no history of pneumonia, tuberculosis.  CARDIAC:  She has no hypertension or chest pain.  GASTROINTESTINAL:  Primary disease or processes is her Crohn's disease.  UROLOGIC:  No history of kidney stones or kidney infection.  GYN:  There is a question of whether she has a fistula to her vagina  from her Crohn's disease.  But she has no symptoms vaginal symptoms.   Her daughter is in the  room. Of note, her daughter has had Crohn's  disease and has a good understanding of what her mother has.  I think  actually her mother's decision-making may be based on her daughter's  experience with Crohn's disease.   PHYSICAL EXAMINATION:  VITAL SIGNS:  Blood pressure 122/76, temperature  98.5, pulse 77, respirations 20, oxygen saturation 98%.  GENERAL:  She is a well-nourished, pleasant white female.  HEENT:  Unremarkable.  NECK:  Supple.  No masses or thyromegaly.  LUNGS:  Clear to auscultation bilaterally.  HEART:  Regular rate and rhythm without murmur or rub.  ABDOMEN:  She is mildly distended and she says she is that way a lot of  the time.  She really has no mass, no tenderness, no guarding.  PELVIC:  I did not do a vaginal exam on her today.  EXTREMITIES:  Good strength in upper and lower extremities.  Without  edema.  NEUROLOGIC:  Grossly intact.   LABORATORY DATA:  She has had blood cultures twice on July 14.  These  cultures are negative right now.  Her sodium is 141, potassium 4,  chloride 112.  Glucose 109, creatinine 0.58.   Clostridium difficile was  negative from July 14.  Total protein 8.3, albumin 3.1.  White blood  count 7700, hemoglobin 10.7, hematocrit 31.2.   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  By CT scan her disease has been surprisingly stable over the  last two years.  There are no new abscesses or area of Crohn's, though  there is the suggestions her left colon is more thickened than before.  Lauren Figueroa saw her and suggested adding some Flagyl.  I talked to  the patient for at least 30 minutes.  I think that she is responding  well to antibiotics.  We could have interventional radiology try to  aspirate this abscess again to decrease the volume of the abscess.  But  since she is doing so well, I don't see a lot of advantage to aspiration  now.   I had a phone conversation with Lauren Figueroa. Because of her recurrent  Crohn's, his question is: Is she better served with a consultation at a  multi-disciplinary inflammatory bowel disease clinic at one of the  medical centers in our area?  He thinks they may be able to provide some  insight into other medical treatment options and the options of surgery.  I would support any path Lauren Figueroa wants to take, whether it is  consultation at a medical center or continued care here in [place].   I have talked to Lauren Figueroa about surgery in the past and during this  hosptialization.  She would really like to hold off until any surgery  until all medical treatment has been exhausted.  She understands that  surgery would have a good chance of giving her a colostomy.   I think she has a good understanding of Crohn's disease from her  personal experience and with her daughter's experience, and I would  support her either having care locally or going to a medical center  where they could manage  her or reinforce that what we are  doing here is appropriate.  I could  again follow her from the surgical standpoint here.   I think she understands all those options.  I discussed them with Dr.  Carlean Figueroa.      Shanon Brow  Bary Leriche, M.D.  Electronically Signed     DHN/MEDQ  D:  11/27/2008  T:  11/28/2008  Job:  [id number]   cc:   Loralee Pacas. Sharlett Iles, MD, FACG, FACP, FAGA  [address] [address]   Carl E. Gessner, MD,FACG  [place]  [place], [place] [address]   [place] Johnnye Sima, M.D.  Fax: [phone]

## 2010-09-27 NOTE — Consult Note (Signed)
NAME:  Lauren Figueroa, Lauren Figueroa NO.:  [id number]   MEDICAL RECORD NO.:  [id number]          PATIENT TYPE:  INP   LOCATION:  5708                         FACILITY:  [facility]   PHYSICIAN:  Fenton Malling. Lucia Gaskins, M.D.  DATE OF BIRTH:  07-Oct-1950   DATE OF CONSULTATION:  02/28/2007  DATE OF DISCHARGE:                                 CONSULTATION   PRIMARY CARE PHYSICIAN:  Dr. Sharlett Iles.   GASTROENTEROLOGY:  Dr. Deatra Ina.   REASON FOR CONSULTATION:  Longstanding Crohn's with new abscess and  fistula formation intra-abdominal.   HISTORY OF PRESENT ILLNESS:  Lauren Figueroa is a 60 year old female  patient with history of Crohn's disease greater than 30 years with no  prior surgical treatment for her Crohn's.  Crohn's itself has been  difficult to treat medically.  Most recently, the patient has been  having success with Humira injections which were begun in March 2008.  She reports for about the past 2 weeks vague issues related to abdominal  pain similar to Crohn's symptoms that were not responding to the Humira  therapies.  Over the past 1 week she has had more intense discomfort  including an episode of pain that was so severe in the right abdomen  that it caused her to have emesis.  Her last Humira dose was on  Saturday.  After that point, her pain actually increased to the worst  she had ever experienced and she developed significant fevers suddenly  101-102 degrees Fahrenheit.  She eventually called her physician because  of the inadequate pain control despite the Humira treatment.  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  Surgical  consultation has been requested.   REVIEW OF SYSTEMS:  As above.   FAMILY HISTORY:  The patient's daughter also has severe colorectal  Crohn's disease, status post total colectomy and  colostomy permanent.   SOCIAL HISTORY:  She is married.  No alcohol.  No tobacco.   PAST MEDICAL HISTORY:  1. Crohn's colitis with history of prior perirectal fistula.  2. Osteoporosis.   PAST SURGICAL HISTORY:  Remote open appendectomy by Dr. Rosana Hoes.   ALLERGIES:  No definite drug allergies but she is intolerant to CIPRO,  FLAGYL and 6MP that caused nausea and vomiting as well as to [facility]  which causes chest discomfort.   MEDICATIONS AT HOME:  1. Humira subcu every 2 weeks.  2. B12 shots.  3. Multiple vitamins.   At [facility] since admission she has been started  on IV Zosyn and Dilaudid for pain, Tylenol for fever, Phenergan for  nausea.   PHYSICAL EXAM:  GENERAL:  Pleasant female patient reporting decreased  abdominal pain since admission.  VITAL SIGNS:  Temperature 97.8, BP 111/67, pulse 61, respirations of 18.  NEUROLOGIC:  The patient is alert and oriented x3, moving all  extremities x4.  No focal deficits.  HEENT:  Head normocephalic, sclera noninjected.  NECK:  Supple.  No adenopathy.  CHEST:  Bilateral lung sounds are clear to auscultation.  Respiratory  effort is nonlabored.  She is on room air.  CARDIAC:  S1 and S2, no obvious rubs, murmurs, thrills or gallops.  Pulses regular.  No JVD.  ABDOMEN:  Distended but soft.  She has had some chronic bloating per her  report for several months.  Bowel sounds are present.  She is focally  tender on the right side of the abdomen just lateral to the umbilical  region.  She does have some mild rebound tenderness on palpation.  EXTREMITIES:  They are thin and symmetrical in appearance without edema,  cyanosis or clubbing.   LABORATORY DATA:  White count 4800, hemoglobin 11.9, platelets 355,000.  Sodium 138, potassium 3.6, CO2 28, glucose 96, BUN 9, creatinine 0.58.  ESR 68.   Diagnostic CT of the abdomen and pelvis as noted.  CT scans have been  reviewed per myself and Dr. Lucia Gaskins.   IMPRESSION:   1. 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  The Crohn's appears to be      primarily colonic.  2. Recent immunosuppression secondary to Humira therapy in which      patient appeared to have responded well.  3. Known osteoporosis.   PLAN:  At this point, okay to continue clear diet.  Will have a PICC  line inserted and recommend TNA for additional preoperative nutritional  support, this will also allow a cool-down period for the abscess and  phlegmon issues over 5-7 days.   [Dr. Lucia Gaskins spoke with Dr. Sharlett Iles by phone.  The enterocutaneous  fistula was probably there on a CT scan two years earlier.  Lauren Figueroa  responded well to Humira over the last 8 months.  It appears any  surgical option carries significant risks of a colostomy, at least  temporarily.  It would appear appropriate to update her colonoscopy to  get a handle on the extent of her colonic disease and continue medical  therapy.  Surgery would truly be a last resort.  The patient and her  daughter, who also has Crohn's, understand the options.]      Allison L. Lissa Merlin, N.P.      Fenton Malling. Lucia Gaskins, M.D.  Electronically Signed    ALE/MEDQ  D:  02/28/2007  T:  03/01/2007  Job:  [id number]   cc:   Loralee Pacas. Sharlett Iles, MD, Marval Regal, FACP, [place] Deatra Ina, MD,FACG

## 2010-09-27 NOTE — Patient Instructions (Signed)
Please go to the basement today for your labs.

## 2010-09-27 NOTE — Assessment & Plan Note (Signed)
[facility] OFFICE NOTE   Lauren Figueroa, Lauren Figueroa                 MRN:          [id number]  DATE:03/25/2007                            DOB:          1950-08-16    Lauren Figueroa returns and is doing well, but has had some slight diarrhea,  generally does not feel as well as she did previously, and has some  vague, dull discomfort in her right lower quadrant.  She denies nausea,  vomiting, fever, chills, bloody or uncontrollable diarrhea, or other  systemic complaints.  She finished her Avelox 400 mg a day and  metronidazole 250 mg t.i.d. 2 days ago.   Repeat CT scan on March 21, 2007 showed slight decrease in her  previous interloop abscess, now measuring 2.2 x 1.3 cm.  The rectum and  sigmoid colon were unremarkable, and there was an enterocolonic fistula  again identified which is unchanged from previous examinations.  I do  not have recent lab data since her discharge.  She has not had Humira  injection now for 2 weeks.   She weighs 138 pounds, and blood pressure is 110/58 and pulse was 70 and  regular.  There was some slight abdominal distension but no organomegaly, masses,  or significant tenderness.  Bowel sounds were normal.   ASSESSMENT:  I discussed her case today with Dr. Carlean Purl and we both  agreed that it would be unwise to probably resume Humira treatment while  she still has presence of an abscess.  I am concerned, however, that her  Crohn's disease may be flaring since she generally does not feel as well  as she had when she previously was on Humira.  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  Dr. Nichola Sizer colonoscopy exam at the time of her hospitalization  only showed a small solitary ulcer at her enterocolonic fistula.   RECOMMENDATIONS:  1. Restart Avelox  and metronidazole.  2. Hold Humira injections.  3. Check CBC and sed rate.  4. Resume B12 injections monthly.  5. Office followup in 10 days, and consider repeat CT scan depending      on clinical course.  6. Continue Florastor (Saccharomyces) probiotic therapy.     Loralee Pacas. Sharlett Iles, MD, Quentin Ore, [place]  Electronically Signed    DRP/MedQ  DD: 03/25/2007  DT: 03/25/2007  Job #: [id number]   cc:   Lew Dawes. Rosana Hoes, M.D.  Gatha Mayer, MD,FACG  Lowella Bandy. Olevia Perches, MD

## 2010-09-27 NOTE — H&P (Signed)
NAME:  Lauren Figueroa, Lauren Figueroa NO.:  [id number]   MEDICAL RECORD NO.:  [id number]          PATIENT TYPE:  INP   LOCATION:  5708                         FACILITY:  [facility]   PHYSICIAN:  Docia Chuck. Henrene Pastor, MD      DATE OF BIRTH:  1951-04-09   DATE OF ADMISSION:  02/27/2007  DATE OF DISCHARGE:                              HISTORY & PHYSICAL   ADMITTING PHYSICIAN:  Docia Chuck. Henrene Pastor, MD   GI PRIMARY:  Loralee Pacas. Sharlett Iles, MD, Marval Regal, [place], FAGA.   PRIMARY CARE PHYSICIAN:  The patient does not have a regular primary  care doctor.   HISTORY OF PRESENT ILLNESS:  Mrs. Edwards is a 61 year old white female  who has a long history of Crohn's ileocolitis dating back to her mid  19s.  She has not had any specific Crohn's disease surgery but is status  post remote appendectomy.  Her latest colonoscopy was March of 2008  showing pancolitis but no disease in the small bowel.  At the time  leading up to that colonoscopy, she had been intolerant to most every  oral medication used to treat her Crohn's disease.  After the  colonoscopy, Dr. Sharlett Iles began Humira.  She is currently on a two-week  dosing schedule of Humira, taking the medication every two Saturdays.  After beginning Humira, she actually improved quite a bit.  She had been  having quite a bit of pain leading up to the colonoscopy, but this  improved with Humira.  She had had a 30-pound weight loss and she  managed to regain her appetite and the weight loss.   For the last 3-4 weeks, the patient has not been doing as well.  She had  a bout of abdominal pain and nausea and fever about three weeks ago on a  Friday.  The next day, Saturday, she took her Humira but noticed it was  not as effective.  She still felt some discomfort.  This past week has  not been a good week.  She has had increased loose stools about four a  day.  On Friday and Saturday, the pain was a lot worse.  She had fever  to 102 degrees.  She took her Humira shot on  Saturday but had no relief.  She called Dr. Buel Ream office and was scheduled for outpatient CT  scan today.  This is showing an abscess near the distal small bowel.  She describes pain as being across the right mid and upper abdomen.  She  has been using Tylenol to some benefit in relieving the pain.  Nausea  was present a few weeks ago and she is somewhat nauseous now but has not  had any emesis since three weeks ago.   MEDICATIONS:  1. Humira every two weeks.  2. B12 shot.  She was getting these previously but she has not had any      since March of 2008.  3. Multivitamin 1 daily.   ALLERGIES:  Her drug intolerances include CIPRO, METRONIDAZOLE, 6MP,  FOSAMAX, most of which cause nausea and vomiting.  She also has  intolerance to CALCIUM and VITAMIN  D.  She does not have any true  allergic response to any medications.  She has had no problems with  taking penicillin, Dilaudid or Phenergan before.   PAST MEDICAL HISTORY:  1. Crohn's colitis and ileitis.  2. History of perirectal fistulas.  3. Osteoporosis and osteopenia.  Note that her last bone density study      in each heart is from January of 2005.   LABORATORY DATA:  Sodium 138, potassium 3.6, chloride 102, carbon  dioxide 28, glucose 96, BUN 8, creatinine 0.5.  Hemoglobin is 11.9,  hematocrit 34.1.  White blood cell count 4.8, MCV 89.5, platelets  355,000.  Sed rate is pending.   FAMILY HISTORY:  She has a daughter who has a history of fistulas and  Crohn's disease.   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  She is  married.  Does not smoke, does not drink.   REVIEW OF SYSTEMS:  CONSTITUTION:  She feels weak and tired.  She has  not changed any dressed sizes but she does feel bloated in her abdomen.  Vision is clear.  She wears eyeglasses for reading.  No double vision.  ENT:  No oral ulcers.  CARDIOVASCULAR:  No chest pain or palpitations.  RESPIRATORY:  Some shortness of breath which is  variable.  It seems that  sometimes if she is having bad pain she will get short of breath but at  other times she is able to walk the distance from the parking place to  work place without problems.  GI:  Please see above.  The patient denies  any blood in her stools.  GU:  She does have a feeling of pressure and  discomfort when she urinates but she does not have any burning or pain  within the urethra.  MUSCULOSKELETAL:  Does have hip and some spine  pain.  SKIN:  No rashes.  NEUROLOGIC:  She had has had some tingling in  her left foot and recently underwent some Doppler studies which she says  were unremarkable.  ENDOCRINE:  No excessive thirst or excessive  urination.  HEMATOLOGIC:  No ENT bleeding.  No unusual bruises.  Prior  colonoscopy as above was in March of 2008.  No upper endoscopies within  the last six years.   PHYSICAL EXAMINATION:  VITAL SIGNS:  Blood pressure 121/79, respirations  18, pulse 102, temperature 97.7, room air saturation 100%.  GENERAL:  The patient is a somewhat thin, unwell and tired appearing,  pale white female.  She is minimally uncomfortable.  HEENT:  Sclerae nonicteric.  Conjunctiva is pink.  Extraocular movements  are intact.  Oropharynx is moist and clear.  No lesions or exudates.  NECK:  There is no masses, no JVD.  LUNGS:  Clear to auscultation and percussion bilaterally.  CARDIOVASCULAR:  Regular rate and rhythm.  No murmurs, rubs or gallops.  ABDOMEN:  Tender throughout the abdomen.  There is no guarding or  rebound.  No focal tenderness.  No masses, no hepatosplenomegaly  appreciated.  No bruits.  EXTREMITIES:  No cyanosis, clubbing or edema.  There is no  lymphadenopathy in the groin or the cervical region.  NEUROLOGIC:  She is alert and oriented x3.  She provides an excellent  history with good recall of dates.  She is appropriate.  Her affect is a  bit blunted, consistent with her feeling unwell.  DERMATOLOGIC:  No bruising.  No rash.  No  petechiae.  IMPRESSION:  1. Longstanding Crohn's colitis and prior history of ileitis although      there was no ileal disease seen on her latest colonoscopy.  Had      been doing quite well with Humira but, within the last few weeks,      having flaring of abdominal pain, looser than usual stools and      fevers.  Now with intra-abdominal abscess evident by the CT scan      today.  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  2. History of multiple drug intolerances.  3. Osteoporosis, does not tolerate Fosamax, calcium or vitamin D.On no      therapy.   PLAN:  The patient being admitted to [facility],Dr. Deatra Ina is the  hospital doctor, service for IV antibiotics.  Will use Zosyn as she does  not have a history of penicillin intolerance.  Also control pain with  Tylenol first line.  This is  her choice.  Dilaudid will be available as needed for more severe pain  and Phenergan available as needed for nausea.  Will begin IV fluids  because I suspect she is a bit dehydrated.  Will plan to call a surgical  consultation in the morning.      Azucena Freed, PA-C      Docia Chuck. Henrene Pastor, MD  Electronically Signed    SG/MEDQ  D:  02/27/2007  T:  02/28/2007  Job:  [id number]

## 2010-09-27 NOTE — Discharge Summary (Signed)
NAME:  Lauren Figueroa, Lauren Figueroa NO.:  [id number]   MEDICAL RECORD NO.:  [id number]          PATIENT TYPE:  EMS   LOCATION:  ED                           FACILITY:  [facility]   PHYSICIAN:  Milton Ferguson, MD      DATE OF BIRTH:  April 24, 1951   DATE OF ADMISSION:  02/27/2007  DATE OF DISCHARGE:  03/09/2007                               DISCHARGE SUMMARY   ANTICIPATED DISCHARGE DATE:  March 09, 2007.   ADMITTING DIAGNOSES:  1. Longstanding history of Crohn's disease, mostly a Crohn's colitis,      though remote history of ileitis.  2. Intra-abdominal abscesses and possible enterocolic fistula, as      evidenced by CT March 09, 2007.  3. Multiple drug intolerances, though no true adverse reactions or      anaphylaxis.  4. Osteoporosis and osteopenia.  5. History of B12 deficiency.  The patient lax about getting follow-up      B12 shots, and her last B12 injection was March 2008.   DISCHARGE DIAGNOSES:  1. Crohn's colitis, inactive by a limited colonoscopy this admission.  2. 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  3. Partial bowel obstruction of the colon in the region of the      fistulized sigmoid.  4. Abdominal abscesses/phlegmon seen in the region of the sigmoid      colon and dome of the bladder.  5. Soft tissue-thickening mass at the dome of the bladder, most      consistent with inflammatory process.  6. Mild anemia, MCV within normal limits.  7. History of vitamin B12 deficiency.  B12 levels as well as folate      levels here in the hospital, within normal limits.  8. Glucose intolerance while receiving TNA.  Serum glucoses normal      before starting TNA.  9. Mild transaminitis associated with the use of T&A.   PROCEDURES:  Colonoscopy by Dr. Delfin Edis on March 06, 2007.  This  study performed to 60 cm, after which a nearly complete constriction of  the colon encountered, unable to locate true lumen of the bowel.   The  only opening out of this region was that of the fistulous tract, which  led directly into the jejunum.  There were some pseudopolyps present in  the sigmoid and in the rectum.  There was nodularity, skin tag and  perianal erythema.  Biopsies from the small intestine showed benign  small intestine mucosa, with reactive lymphoid aggregate.  Biopsies of  the fistulized area of 50 cm showed fragments of colonic-type mucosa  with mucosal crypt architecture distortion.  Focal active mucosal  inflammation and chronic inflammation extended into the muscularis  mucosa.  Rectal biopsy showed minimal active mucosal inflammation and  distortion of crypt architecture.  No granulomas, dysplasia or  malignancy seen.  PICC line placed.   CONSULTATIONS:  With Dr. Alphonsa Overall from general surgery.   BRIEF HISTORY:  Lauren Figueroa is a pleasant 60 year old white female with  longstanding history of Crohn's disease of more than 30 years.  She has  not  previously required surgical treatment.  Medical management has been  challenging, because she has had significant GI intolerance of  medications, including methylamine products and 6MP.  In March 2008, she  underwent her most recent colonoscopy showing pancolitis and no small  bowel disease.  She was began Humira treatment after the colonoscopy and  improved significantly.  Her biggest complaint had been abdominal pain,  weight loss and some loose stools but more just abdominal discomfort.  She managed to regain some weight, regain her appetite and was  relatively pain free.  However, for the last month or so, she has been  getting recurrences of the abdominal distress and nausea and isolated  fever.  Her Humira shot about 3 weeks ago, her second to last shot of  Humira, resulted in less than usual improvement in her symptoms.  She  still was feeling discomfort.  The past week before admission was not a  good week, she was having loose stools about 4  times a day.  The pain  got much worse, and she had a fever of 102 degrees.  That Saturday, she  took her Humira but got no relief.  Dr. Buel Ream office arranged for  her to have a CT scan, which took place on October 15.  The CT scan  showed abscesses near the distal small bowel and possibly an enterocolic  fistula.  She was advised to come to the hospital for admission.  The  pain was located across the right mid and upper abdomen. and it was,  interestingly, relieved with Tylenol.  Nausea had been present a few  weeks ago, but she had only complained of mild queasiness and no recent  emesis at the time of presenting to the hospital.  When examined, she  was afebrile and had stable vital signs.  She was admitted for IV  antibiotics, bowel rest and surgical consultation.   LABS:  White blood cell counts on admission was 5.1, 4.5 at discharge.  Hemoglobin 11.7 on admission, 10.7 at discharge, hematocrit 31.1 at  discharge.  MCV 90.4.  The platelets ranged to 255 to 355,000.  Sodium  138, potassium 3.6, chloride 102, CO2 28.  Serum glucose ranging 96 on  admission to a high of 119.  BUN 13, creatinine 0.68.  Total bilirubin  0.5, alkaline phosphatase ranging 61-69, AST maximum of 94, 71 at  discharge.  ALT 93 at discharge.  Albumin 3.2, magnesium 2, phosphorus  4.1.  Calcium 9.1.  Prealbumin 23.8.  Cholesterol total 133,  triglycerides 103.  Urinalysis negative.  C. diff toxin on three  separate occasions all negative.  Sed rate 255.   IMAGING STUDIES:  1. Portable chest film showed a PICC line in place in the right upper      extremity, no acute cardiopulmonary disease. CT scan of the      abdomen/pelvis of October 20 showed Crohn's involvement in the      sigmoid and distal small bowel with probable fistulous tract      between the ileum and sigmoid colon.  A 2.4 x 1.4-cm fluid      collection adjacent to the sigmoid colon and dome of bladder, most      likely represents a small  abscess, appeared to be decreased in size      when compared with the CT scan from October 15.  Marked soft-tissue      thickening/mass associated with dome of urinary bladder and      adjacent  sigmoid colon most consistent with inflammatory mass,      appeared unchanged.  2. Single contrast barium enema of March 06, 2007 demonstrated      fistula between the sigmoid colon and small bowel, either distal      jejunum or proximal ileum, in the left lower quadrant.  Narrowing      of the sigmoid colon and site of fistula causing some obstruction      to flow, but certainly no complete obstruction observed.   HOSPITAL COURSE:  1. Intra-abdominal abscesses and fistulous disease.  The patient was      immediately started on Zosyn therapy.  She also was started on IV      fluids and sips of clear liquids.  Within 12 hours, she was      clinically improved.  She did not require significant amounts of      the Demerol we gave her.  She did not require significant amounts      of Dilaudid for pain control.  In fact, towards the end of her      hospital stay, Tylenol was adequate for the infrequent bouts of      pain.  The patient was seen in consultation by Dr. Lucia Gaskins for      surgical opinion.  He suggested medical management and that she      possibly might require surgery in the future, but we should try to      calm down the abscesses, if possible before surgery.  He suggested      PICC line insertion and initiation of TNA, to allow for a cool down      period.  Eventually, the patient underwent colonoscopy at which      point the fistula was well demonstrated, and the obstruction in the      region of the fistula was encountered.  For further imaging of the      bowel and delineation of the fistula, the patient underwent barium      enema.  Fortunately, this study did show that she did not have      complete obstruction of her bowel and did demonstrate the clear      fistulous tract.   Despite the fact that the patient had      demonstrated abscesses, she did not have any symptoms suggestive of      sepsis.   The patient was slowly re-started on diet and at the time of this  dictation is tolerating a low-residue bland-type diet.  Pain is not a  significant feature and has not been for several days.   At the point of this dictation, there are no set plans for surgery.  Patient is clinically tolerating her abscesses and the fistula quite  well.  The plan is for her to be discharged home with appropriate  antibiotic therapy, hopefully p.o. Augmentin.  She will probably be  referred on to a tertiary care center for opinion as to how to manage  both the fistulous disease which has developed while she has been taking  Humira, as well as whether or not she should undergo surgery and when  she should undergo surgery.   Interestingly in the observed area of the mucosa up to 60 cm the mucosa  itself was certainly well-healed in terms of active Crohn disease  compared with her colonoscopy in March of this year.  However, the  fistula was a seemingly new finding, though there is a  possibility that  the fistula was in an early stage at the time of the colonoscopy and was  simply too small to appreciate on colonoscopy.   Because the Humira has provided benefit in healing the overall  appearance of Crohn disease, it has been decided to continue her bi-  weekly Humira treatments, with close observation for any worsening of  abscess, development of systemic infections. or continuous fevers to  suggest that the Humira is compromising her immune status too much and  needs to be discontinued.  But for now, the Humira will be continued, as  it offers obvious benefits for this patient.   CONSULTATION:  1. Obtained with infectious disease, Dr. Megan Salon, regarding      outpatient antibiotic management.  He suggested initiating      Augmentin therapy, and Augmentin was started on October  23.  2. Drug eruption type rash.  On October 23, the patient developed a      maculopapular, pruritic rash consistent with drug eruption.  This      rash occurred before starting the Augmentin.  There was concern      that Imodium may have caused the rash, so Dr. Olevia Perches discontinued      this medication.  More worrisome is the possibility that the Zosyn      was the source for the rash, in which case there may be continued      rash while using Augmentin.  She is being watched.  The rash is      being followed.  She is receiving treatment with Benadryl.  If the      rash continues, she may need to stop the Augmentin and we will have      to re-address appropriate antibiotic therapy.  3. Glucose intolerance.  The patient was noted to develop mild      hyperglycemia, while receiving TNA.  She had did have insulin added      both to the T&A and was treated with sliding scale insulin as      needed.  It is not anticipated that she will need to continue any      insulin or other diabetic medication at discharge.  4. Mild transaminitis.  Transaminase is increased about twice normal.      This was felt secondary to the use of TNA.  TNA will be      discontinued prior to discharge.  It will be discontinued, once she      is reliably taking p.o. meals adequate to sustain caloric needs.  5. History of B12 deficiency.  Her B12 levels are adequate by assay      here, but we did treat her with a 1000-mcg shot of B12.   CONDITION AT DISCHARGE:  Stable and improved.   ANTICIPATED DATE OF DISCHARGE:  Saturday, October 25.  It is possible if  she continues to have drug rash that we will need to prolong her  hospital stay.   MEDICATIONS AT DISCHARGE:  1. Augmentin 875 mg twice daily.  2. Multivitamin once daily.  3. B12 shots monthly.  4. Tylenol Extra Strength as needed for pain.   FOLLOWUP:  Office appointment with Dr. Sharlett Iles on October 30 at 8:45.  Follow-up appointment with surgeon Dr. Florestine Avers.  The surgical office  will be calling the patient with her appointment.   DISCHARGE DIET:  Low fiber.      Azucena Freed, PA-C    ______________________________  Milton Ferguson, MD  SG/MEDQ  D:  03/08/2007  T:  03/09/2007  Job:  [id number]   cc:   Loralee Pacas. Sharlett Iles, MD, Quentin Ore, FAGA  Fenton Malling. Lucia Gaskins, M.D.

## 2010-09-27 NOTE — Procedures (Signed)
DUPLEX DEEP VENOUS EXAM - LOWER EXTREMITY   INDICATION:  Left leg varicose veins, pain, swelling, edema.   HISTORY:  Edema:  Periodic bilateral lower extremity edema.  Trauma/Surgery:  Patient struck her leg on the left lateral aspect of  the calf at a significant varicose vein cluster one month ago.  Pain:  Patient complains of left foot numbness and pain in her thighs  bilaterally.  PE:  No.  Previous DVT:  No.  Anticoagulants:  No.  Other:  Crohn's disease.   DUPLEX EXAM:                CFV   SFV   PopV  PTV    GSV                R  L  R  L  R  L  R   L  R  L  Thrombosis    0  0     0     0      0     0  Spontaneous   +  +     +     +      +     +  Phasic        +  +     +     +      +     +  Augmentation  +  +     +     +      +     +  Compressible  +  +     +     +      +     +  Competent     +  +     +     +      +     +   Legend:  + - yes  o - no  p - partial  D - decreased   IMPRESSION:  1. No evidence of left leg deep venous thrombosis, superficial vein      thrombosis, or baker's cyst.  2. No evidence of left greater saphenous vein reflux with the patient      lying or in a standing position.  3. 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    _____________________________  V. Leia Alf, MD   MC/MEDQ  D:  02/11/2007  T:  02/12/2007  Job:  [id number]

## 2010-09-27 NOTE — Discharge Summary (Signed)
NAME:  Lauren Figueroa, Lauren Figueroa NO.:  [id number]   MEDICAL RECORD NO.:  [id number]          PATIENT TYPE:  INP   LOCATION:  5708                         FACILITY:  [facility]   PHYSICIAN:  Gatha Mayer, MD,FACGDATE OF BIRTH:  07/25/1950   DATE OF ADMISSION:  02/27/2007  DATE OF DISCHARGE:  03/11/2007                               DISCHARGE SUMMARY   DISCHARGE SUMMARY ADDENDUM:  The patient discharge was delayed by drug reaction to medication.  She  developed a rash just before starting Augmentin.  There was some thought  that it might be Imodium, though the antibiotic was obviously the  primary consideration.  Indeed, even with stopping the Zosyn and  continuing the Augmentin, she continued to have the rash and it  definitely worsened and after getting a couple more doses of Augmentin.  The rash was ultimately treated with Benadryl and then resolved after a  dose of Solu-Medrol.  The Augmentin was discontinued.  Dr. Megan Salon was  helpful and came up with an antibiotic regimen which the patient  tolerated.  The patient was started on Avelox 400 a day and  metronidazole 250 mg three times a day.  Significantly, she was able to  tolerate the Flagyl at that low dose.  She was tolerating diet.  She was  not having significant diarrhea over the weekend and she was discharged  to home on Monday.   Medications as follows.  Multivitamin once a day, B12 shots once  monthly, Tylenol Extra Strength as needed for pain, Avelox 400 mg once  daily, metronidazole 250 mg four times daily, that is every 6 hours,  which is a slightly increased dose from which she was getting in the  hospital.   CONDITION ON DISCHARGE:  Stable and improved.   ADDITIONAL DIAGNOSIS FOR THIS PATIENT NOW INCLUDES TRUE DRUG REACTION TO  PENICILLINS WITH A WIDESPREAD REACTION BEING A PRURITIC RASH.      Azucena Freed, PA-C      Gatha Mayer, MD,FACG  Electronically Signed    SG/MEDQ  D:  03/11/2007   T:  03/11/2007  Job:  [id number]   cc:   Armando Gang, Dr.  Loralee Pacas. Sharlett Iles, MD, Quentin Ore, [place]

## 2010-09-27 NOTE — H&P (Signed)
NAME:  Lauren Figueroa, Lauren Figueroa NO.:  [id number]   MEDICAL RECORD NO.:  [id number]          PATIENT TYPE:  INP   LOCATION:  7989                         FACILITY:  [facility]   PHYSICIAN:  Gatha Mayer, MD,FACGDATE OF BIRTH:  05-13-1951   DATE OF ADMISSION:  11/25/2008  DATE OF DISCHARGE:                              HISTORY & PHYSICAL   PRIMARY GASTROENTEROLOGIST:  Loralee Pacas. Sharlett Iles, MD   HISTORY OF PRESENT ILLNESS:  Ms. Vosler is a 60 year old female who  has been followed by Dr. Sharlett Iles for years for Crohn's ileocolitis.  She has a history of fistulizing Crohn's (small bowel to sigmoid) and  enteric/vaginal fistula as well as pelvic abscesses requiring CT-guided  aspiration in March of this year.  The patient saw Dr. Sharlett Iles in the  office yesterday for recent history of high fever, chills, nausea,  heaves, and loose, nonbloody stools.  Labs from 2 days ago revealed a  white count of 7.0, hemoglobin 12.0, potassium 3.4.  Normal renal  function.  Normal LFTs.  Normal TSH.  The patient's erythrocyte  sedimentation rate was elevated at 103.  Her urinalysis showed a small  amount of blood, otherwise normal.  The patient's heaves and loose stool  started over the weekend, no further heaving since Sunday until the  patient tried to drink her contrast for CT scan which Dr. Sharlett Iles had  ordered.  Her stools are becoming slightly more form.  She does not have  any abdominal pain, no dysuria.  She does think that she passes stool  through her vagina sometimes.  Her temperature currently is 99.2, but it  was elevated at 101.3 early this morning.   PAST MEDICAL HISTORY:  Crohn's complicated by fistula/pelvic abscesses.  The patient has history of hemorrhoids, osteoporosis.  She has a remote  history of appendectomy.   HOME MEDICATIONS:  Humira 40 mg every 2 weeks, Analpram cream, and also  vitamins.   ALLERGIES:  CIPRO, PENICILLIN, and FLAGYL.   FAMILY HISTORY:  No  colorectal cancer.  Positive family history of  coronary artery disease and diabetes.   SOCIAL HISTORY:  Former smoker.   REVIEW OF SYSTEMS:  Positive for recent fevers.  All other review of  systems reviewed and negative other than were noted in the HPI.   PREVIOUS ENDOSCOPIC HISTORY:  The patient had a colonoscopy in October  2008.  The exam was not complete secondary to a colonic stricture at 60  cm.  A colonic enteric fistula was seen and small bowel biopsy was  obtained.  Small bowel biopsy was unremarkable.   LABORATORY STUDIES:  The labs to be ordered.   PHYSICAL ASSESSMENT:  VITAL SIGNS:  Temperature 99.2, heart rate 83,  respirations 16, blood pressure 125/78, and O2 saturation 95% on room  air.  HEENT:  Conjunctivae are pink.  No icterus.  Tongue is moist.  NECK:  Supple.  No masses felt.  LUNGS:  Clear to auscultation bilaterally.  CARDIAC:  Regular rate and rhythm.  Soft murmur is present.  ABDOMEN:  Soft, mild to moderately distended but with positive bowel  sounds.  No tenderness noted.  No masses felt.  EXTREMITIES:  No edema or lymphadenopathy.  No cervical lymphadenopathy  appreciated.  NEUROLOGICAL:  Alert and oriented.   IMPRESSION:  82. A 60 year old female with longstanding history of fistulizing      Crohn's/pelvic abscess requiring CT drainage in March 2010.  The      patient is maintained on Humira every 2 weeks.  She is now      hospitalized with a several week history of high fevers, recent dry      heaves, and increased frequency of loose stool in the setting of an      erythrocyte sedimentation rate of 103.  Rule out recurrent pelvic      abscess.  2. Recent antibiotics given at [place] for fever and      bacterial infection.  Need to exclude superimposed infectious      colitis.   PLAN:  1. We will admit for IV fluids, stool studies, IV antibiotics, CT scan      of the abdomen and pelvis when the patient is able to hold on       contrast.  2. We will obtain blood cultures given recent fever and chills.  3. She will be given DVT prophylaxis as she is at high risk with      history of inflammatory bowel disease.      Tye Savoy, NP      Gatha Mayer, MD,FACG  Electronically Signed    PG/MEDQ  D:  11/25/2008  T:  11/26/2008  Job:  [id number]

## 2010-09-27 NOTE — Assessment & Plan Note (Signed)
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  Several CT scans of the abdomen showed a phlegmon in her lower abdominal  and suprabladder area without  a definite abcess.She received  antibiotics and had steady improvement.  She was seen in infectious  disease consultation by Dr. Michel Bickers.   It is of Figueroa the patient had been receiving Humira since mid March,  2008.  At that time, she had colonoscopy performed that showed pan-  colonic disease without small bowel disease.   Patient was followed regularly in the office and had a rather remarkable  improvement on Humira, using it every other week.  She was gaining  weight, had no diarrhea, and her peri-rectal problems had markedly  improved as had her gas and bloating.  She actually was seen on December 11, 2006 and had gained 30 pounds in weight since the spring.  She also had  some diarrhea and symptoms suggestive of a bacterial over-growth  syndrome.  She was treated with Xifaxan 400 mg t.i.d. for 10 days along  with Align probiotic therapy in the first two weeks in August.  Lab data  at that time was fairly normal except for a sed rate of 55.   The patient had previous bowel evaluations with CT scan of the abdomen  in July, 2002 showing Crohn's colitis in the transverse and sigmoid  colon without a definite fistula.  She underwent barium enema at that  time that did show extensive inflammatory disease of the sigmoid colon  all the way to the cecum with  suggestion of a possible sigmoid colon  fistula.  There was foreshortening of the colon, as noted at the time of  her recent colonoscopy.   The patient really has been on very limited treatment for her Crohn's  disease before the spring because of intolerance to aminosalicylates,  prednisone, and she had rather severe reactions to 6MP.  She also is  intolerant to MULTIPLE ANTIBIOTICS.  Over the years, she has been seen  by myself and Dr. Lennie Hummer for fistulizing peri-rectal problems and  recurrent inflammatory hemorrhoids.  Also, her treatment over the years  has been complicated by poor compliance.  In the past, for rectal exam,  Dr. Rosana Hoes has had to give her general anesthesia.  Eaerlier this year,  she was treated with an empiric course of Entocort, but she apparently  did not take this medication on further questioning.   Recent colonoscopy by Dr. Olevia Perches on March 06, 2007 showed stricturing  of the colon at 60 cm with a rather prominent small bowel to colonic  fistula.  There was a solitary ulcer noted at  that time, but the small  bowel apparently appeared normal.  The rest of the colon showed multiple  pseudopolyps without definite active colitis.  Review of the biopsies of  that colonoscopy showed no evidence of small bowel inflammation and no  evidence of colonic dysplasia.   Patient has been discharged on daily Avelox 400 mg, along with  metronidazole 250 mg 4 times a day.  She currently is asymptomatic and  is having no abdominal pain or systemic complaints.  She does have some  loose stools but no rectal incontinence.  She also denies rectal  bleeding or any gynecologic complaints.  Her appetite is good, and her  weight is stable.  She did take a dose of Humira approximately four days  ago subcutaneously.  She is on no other medications at this time except  for p.r.n. Analmantle cream locally.   PHYSICAL EXAMINATION:  Exam today shows her to be a healthy-appearing  white  female in no distress, appearing her stated age.  She has no  stigmata of chronic liver disease.  ABDOMEN:  No distention, organomegaly, masses, or tenderness.  Bowel  sounds are not hyperactive or obstructive in quality.  Patient did not  wish for rectal exam at this time.   ASSESSMENT:  This is an extremely difficult case of a patient with  fistulizing stenosing inflammatory bowel disease over the last 30 years  without having had to have prior surgical intervention.  She was seen by  Dr. Alphonsa Overall during her hospitalization, who felt that surgery would  be rather extensive and not warranted at this time.  In any case, the  patient does not desire surgery or referral to a tertiary medical  center.  She obviously has stenosis of her sigmoid colon area and has  expansion of a previously noted entero-colonic fistula.  The exact cause  of why she had an associated phlegmon/abscess is unclear.  In any case,  she seems to be recovering with antibiotic therapy at this time, and I  am somewhat reluctant to continue Humira until we have had follow-up CT  scan.   RECOMMENDATIONS:  Patient is to continue her antibiotics until we  perform CT scan next week to further assess the healing of the  inflammatory process in her abdominal/pelvic area.  I have placed her on  Florastor (saccharomyces) probiotic therapy in the interim.  I will  probably restart Humira after her inflammatory process has resolved and  will continue to follow her from a medical standpoint, as mentioned  above.  She denies any symptoms currently of hematuria or urinary tract  infection.  At the time of her scan, there was some evidence of  continuous inflammation of her urinary bladder.     Loralee Pacas. Sharlett Iles, MD, Quentin Ore, [place]  Electronically Signed    DRP/MedQ  DD: 03/14/2007  DT: 03/14/2007  Job #: [id number]   cc:   Gatha Mayer, MD,FACG  Lowella Bandy. Olevia Perches, MD  Lew Dawes Rosana Hoes, M.D.

## 2010-09-27 NOTE — Progress Notes (Signed)
This is a extremely pleasant 60 year old Caucasian female that I have followed for 30 years with Crohn's ileocolitis with recurrent pelvic, enteric, and perirectal fistulae. She was on Humira for a good period of time and no treatment for the last 2 years. Client states she is a dental problem in the past. He currently denies any GI complaints, specifically abdominal pain, bowel irregularity, or rectal drainage. Her appetite is good, her weight is stable, she has a good sense of well being.   Current Medications, Allergies, Past Medical History, Past Surgical History, Family History and Social History were reviewed in [employer] record.  Pertinent Review of Systems Negative... possible sleep apnea but she does not desire evaluation.   Physical Exam: She appears as healthy as I have seen her in many years. No stigmata of chronic liver disease. Her chest is clear cardiac exam is unremarkable. Her abdomen shows no definite organomegaly except for some fullness in the left lower quadrant area without tenderness. Bowel sounds are nonobstructive. Specks the rectum shows a anterior fistula which appears chronic in nature without drainage, erythema or tenderness. There is a posterior skin tag noted. Rectal exam is deferred at her request. Mental status clear, though no gross neurological deficits.    Assessment and Plan: Chronic fistulizing Crohn's disease currently in remission in terms of any specific symptomatology. She appears well-nourished, and is satisfied with her current clinical status. We will check her yearly blood work for review, and she will see [place] on a when necessary basis as needed. Encounter Diagnosis  Name Primary?  . Regional enteritis of unspecified site Yes

## 2010-09-27 NOTE — Assessment & Plan Note (Signed)
[place] OFFICE NOTE   VALEDA, CORZINE                 MRN:          [id number]  DATE:06/07/2007                            DOB:          06-29-1950    Lauren Figueroa comes for followup.  When she was last seen in December and  her Humira was restarted after CT scan showed some air in the sigmoid  colon with adjacent Crohn's disease, but her small abscess, associated  with her fistula and the Crohn's disease, had decreased in size.  She  restarted Humira 40 mg over the week, has been doing well, and had  repeat CT scan again on June 05, 2007.  This was done at [facility]  Radiology and read by Dr. Lavonia Dana.  It was felt to show improved  inflammation in her abdomen; the previous small abscess was down to 1.2  by 1.2 cm, decreased from 2.2 by 1.8.  There was minimal inflammation  obvious in the sigmoid colon area.  Since drinking her contrast,  Lauren Figueroa has felt that she has had more abdominal pain and diarrhea,  but denies fever, chills, or rectal bleeding.  She has not had a Humira  injection in two weeks.  She comes to the office today for repeat  evaluation.   She is having a bad day.  She appears slightly ill, but is not septic  and certainly her weight of 138 is up another pound from previous visit.  She is afebrile and blood pressure 130/76 and pulse was 76 and regular.  She was distended, as per previous exams and did have some rushes and  tinkles in her lower abdomen.  There appeared to be fullness in the left  lower quadrant area with some mild tenderness.  Patient refused rectal  exam.   ASSESSMENT:  Lauren Figueroa is somewhat puzzling in that her clinical  picture, at least for TODAY, is somewhat out of character with the way  she has been doing, in general, and with her CT scan report.  She, as  per my previous notes, has fistulizing Crohn's disease and really had a  relapse end of  November after going off her biologicals of being on  antibiotics for several weeks.   RECOMMENDATIONS:  1. Repeat stat CBC and sed rate.  2. If CBC and sed rate are stable, we will probably have her go ahead      and give herself every-other-week Humira injection 40 mg and see      how she does clinically.  She may need to go up to every-week      dosage.  3. Continue all other medications, which only include multivitamins      and Analpram cream.  4. Continue surgical followups with Dr. Rosana Hoes and Lauren Figueroa.  Patient has      been very reluctant to undergo any surgical intervention, but she      is certainly aware that this is most likely a possibility within      the next several months, depending on clinical course.     Loralee Pacas. Sharlett Iles, MD, Quentin Ore, [place]  Electronically Signed  DRP/MedQ  DD: 06/07/2007  DT: 06/07/2007  Job #: [id number]   cc:   Fenton Malling. Lauren Figueroa, M.D.  Gatha Mayer, MD,FACG  Lew Dawes. Rosana Hoes, M.D.

## 2010-09-27 NOTE — Assessment & Plan Note (Signed)
OFFICE VISIT   Lauren Figueroa, Lauren Figueroa  DOB:  1951/01/15                                       02/11/2007  LEXNT#:[id number]   PRIMARY:  Dr. Verl Blalock.   REASON FOR VISIT:  Question phlebitis.   HISTORY:  This is a 60 year old female whom I am seeing at the request  of Dr. Rosana Hoes for evaluation of venous disease.  The patient has a  history of Crohn's disease.  However, around Labor Day, she bumped a  cluster of varicosities in the left anterior side of her leg, and has  been having pain since that time.  She has done this on other occasions.  However, at this time went to have this evaluation and is sent to [place] for  evaluation.   REVIEW OF SYSTEMS:  The patient does not have any history of a DVT.  She  has never had any venous surgeries.  Her varicosities have been present  since pregnancy.  She does not have significant swelling and  occasionally has pain and discomfort.  She has compression stockings,  but has not worn them consistently.   PAST MEDICAL HISTORY:  Significant for Crohn's disease.   PAST SURGICAL HISTORY:  Appendectomy.   FAMILY HISTORY:  Positive for heart disease in mother and lung cancer in  father.  She had a brother at age 56 died of a heart attack.   SOCIAL HISTORY:  She is married with 2 children and works as a  Teacher, adult education.  She does not smoke.  She has a history of  smoking, but quit in 1974.  She denies any alcohol.   MEDICATIONS:  Include Humira infusions twice a month.   ALLERGIES:  Flagyl.   PHYSICAL EXAM:  Heart rate is 56, blood pressure 139/76.  In general,  well-appearing and in no acute distress.  Cardiovascular is regular rate  and rhythm.  Abdomen is soft.  Extremities are warm and well-perfused.  The patient has multiple fossa of telangiectasias.  There is also a  cluster of varicosities on the anterior lateral side of her left leg  just below her knee.  These are somewhat uncomfortable  for her.  They  are prominent upon standing.  She does not have significant edema.  There are no open wounds.  There is no evidence of infection.  There is  no evidence of thrombophlebitis.   DIAGNOSTIC STUDIES:  The patient underwent duplex evaluation today,  which does not show any evidence of reflux while lying or standing.  There is also no evidence of DVT.   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  At  this point, I have recommended the patient begin wearing her compression  garments.  I have also given her a new prescription for thigh  compression.  We will see if these help alleviate her symptoms.  At this  time, she is not ready to undergo a procedure.  However, we did discuss  stab phlebectomy versus injection sclerotherapy.  We will have her  follow up in our office in 6 weeks' time.  This will be done on a day  when Dr. Kellie Simmering and our vein staff is available to also evaluate her.   Eldridge Abrahams, MD  Electronically Signed   VWB/MEDQ  D:  02/11/2007  T:  02/12/2007  Job:  [id number]

## 2010-09-27 NOTE — Assessment & Plan Note (Signed)
Lauren Figueroa OFFICE NOTE   Lauren Figueroa, Lauren Figueroa                 MRN:          [id number]  DATE:12/11/2006                            DOB:          [birth year]    Lauren Figueroa is doing extremely well on Humira injections every other week.  She is gaining weight, feels good, and is having no significant diarrhea  except for a couple of days before injections.  She has a lot of  abdominal gas and bloating, and probably has an element of bacterial  overgrowth syndrome.  She also has been started on B12 replacement.  She  has sensitivities to multiple drugs, including Cipro and other  antibiotics.   PHYSICAL EXAMINATION:  GENERAL:  She is a healthy-appearing white female  in no distress.  VITAL SIGNS:  She weighs 134 pounds, which is up 17 pounds from April  2008.  Blood pressure is 112/62, and pulse is 72 and regular.  ABDOMEN:  Slightly distended and slightly doughy, with some palpable  bowel loops in the lower abdomen.  RECTAL:  Deferred.   ASSESSMENT:  I think that Lauren Figueroa's rather severe Crohn's disease is  responding nicely to Humira therapy.  I suspect that she has an element  of bacterial overgrowth syndrome accounting for some of her diarrhea.   RECOMMENDATIONS:  1. Repeat lab data, including CBC, sed rate , and metabolic profile.      Hopefully, her albumin level will be rising with her improvement.  2. Continue every-other-month Humira injections per standard protocol.  3. Trial of Xifaxan 400 mg t.i.d. for 10 days, along with Align      probiotic therapy.  4. Continue B12 replacement therapy.  5. Follow up at 2-3 month intervals unless otherwise indicated.     Loralee Pacas. Sharlett Iles, MD, Quentin Ore, [place]  Electronically Signed    DRP/MedQ  DD: 12/11/2006  DT: 12/12/2006  Job #: [id number]

## 2010-09-27 NOTE — Discharge Summary (Signed)
NAME:  Lauren Figueroa, Lauren Figueroa NO.:  [id number]   MEDICAL RECORD NO.:  [id number]          PATIENT TYPE:  INP   LOCATION:  1342                         FACILITY:  [facility]   PHYSICIAN:  Gatha Mayer, MD,FACGDATE OF BIRTH:  [birth year]   DATE OF ADMISSION:  11/25/2008  DATE OF DISCHARGE:  11/28/2008                               DISCHARGE SUMMARY   DISPOSITION:  Home in stable condition.   DISCHARGE MEDICATIONS:  1. Avelox 400 mg 1 daily for 2 weeks.  2. Flagyl 500 mg 1 q.i.d. for 2 weeks.  3. Continue home medications including Humira 40 mg subcu q.2 weeks.   CONSULTATIONS:  1. Fenton Malling. Lucia Gaskins, MD, (Surgery) was consulted.  2. Infectious Disease.   PROCEDURES:  None performed this admission.   ADMITTING DIAGNOSES:  1. Longstanding history of fistulizing Crohn's with pelvic abscesses.  2. Nausea, vomiting, diarrhea, fever, and elevated erythrocyte      sedimentation rate.  Rule out recurrent pelvic abscess.  3. Anemia.  4. Hypokalemia.   HOSPITAL COURSE:  Lauren Figueroa is a 60 year old female who has been  followed by Dr. Sharlett Iles in our office for history of Crohn  ileocolitis.  The patient has a known history of visualizing Crohn's and  pelvic abscesses requiring CT-guided aspiration in March of this year.  The day prior to admission, the patient saw Dr. Sharlett Iles for fever,  chills, nausea, vomiting and loose, nonbloody stools.  Labs on that day  revealed a white count of 7.0, hemoglobin of 12, potassium 3.4, normal  renal function, normal LFTs, normal thyroid stimulating hormone,  elevated erythrocyte sedimentation rate of 103.  Urinalysis showed small  blood, but otherwise negative.  The patient's nausea, vomiting, and  loose stools had started the previous weekend.  By the time of hospital  admission, her stools were becoming more solid in nature.  At the time  of admission, the patient did not have any abdominal pain, no dysuria.  On admission, her  temperature was 99.2, but had been 103.3 earlier at  home prior to coming to the hospital.  After receiving the patient's lab  studies (specifically her elevated erythrocyte sedimentation rate) and  her history of high fevers at home, it was decided to have the patient  come to the hospital for direct admission.  She was admitted to a  medical bed under the care of Dr. Silvano Rusk who was on service of the  hospital.  Blood cultures were ordered, the patient was given IV fluids  with potassium.  She had bilateral SCDs for DVT prophylaxis.  A CT of  the abdomen and pelvis was ordered as it was already supposed to be  scheduled by our office as a result of the patient's appointment with  Dr. Sharlett Iles.  Stool studies for C. difficile and culture were ordered  as well.  On the second day of admission, the patient still had a low-  grade temperature of 99.2.  She reported several small volume watery  stools.  White count remained normal.  CT of the abdomen and pelvis  obtained and it showed a 3 x 4.5 cm abscess  within the central pelvis  above the bladder, which appeared relatively unchanged.  There was a  fistula extending from the sigmoid colon to the abscess.  There was mild  wall thickening of portions of the descending colon, suggesting active  Crohn's inflammation.  No evidence of bowel obstruction,  pneumoperitoneum, free fluid, or enlarged lymph nodes.  Because of  persistent fevers, Infectious Disease was consulted.  They added Flagyl  to Avelox and recommended continuing both antibiotics for a total of 3  weeks.  Dr. Lucia Gaskins (Surgery) was also consulted.  He has followed Ms.  Figueroa in the past.  Dr. Lucia Gaskins felt that the pelvic abscess has  remained stable and that the patient was clinically doing okay.  He did  agree with Dr. Celesta Aver suggestion of the patient going to a  multidisciplinary inflammatory bowel disease clinic.  Lauren Figueroa has  been reluctant to have surgery.  On  November 28, 2008, the patient was  feeling better, she was afebrile.  Blood cultures were negative.  On  November 28, 2008, the patient wanted to go home and we felt she was  medically stable to do so.  She was discharged home on above medications  in stable condition.  Because the patient was discharged on the weekend,  we were unable to make a followup appointment with Dr. Sharlett Iles, her  primary gastroenterologist.  She was advised to call our office for an  appointment.  She was discharged home on a low-residue diet.   DISCHARGE DIAGNOSES:  1. Crohn disease complicated by fistula from the sigmoid colon to a      pelvic abscess.  The patient has been on long-term Humira.  Surgery      evaluated her at this admission and felt that abscess was      unchanged.  CT did show the pelvic abscess as well as a focal wall      thickening of portions of the descending colon, suggesting active      Crohn disease.  2. Fevers, likely secondary to Crohn disease, resolved.  3. Hypokalemia, resolved.  4. Anemia, stable.      Tye Savoy, NP      Gatha Mayer, MD,FACG  Electronically Signed    PG/MEDQ  D:  12/18/2008  T:  12/18/2008  Job:  [id number]

## 2010-09-30 NOTE — Assessment & Plan Note (Signed)
[facility] OFFICE NOTE   RAYSHAWN, VISCONTI                 MRN:          [id number]  DATE:07/05/2006                            DOB:          21-Nov-1950    July 05, 2006   Lulla is a 60 year old white female that I have followed for at  least 30 years because of Crohn's ileocolitis.  She has had mostly  colonic disease with perirectal fistulas.  She had been intolerant to  almost all medications for inflammatory bowel disease and has had severe  reaction in the past to metronidazole and 6MP.  Her course has also been  complicated throughout the years with poor compliance in terms of taking  her medications and any follow-up.  She has had rather severe  sensitivity and pain around her rectum as in the past.  Required general  anesthesia for rectal examination.   I last saw her in February of 2006 at which time she was having  diarrhea, abdominal cramping and perirectal problems.  She was placed on  a trial of Entocort 9 mg a day which she did not apparently take.  Lab  data at that time showed normal CBC and metabolic profile but a sed rate  of 58.  CT scan of the abdomen showed diffuse colitis with a small  hepatic cyst and a slightly prominent pancreatic head which had been  noticed on previous CT scan.  Her last colonoscopy exam was in October  2002, at which time she had multiple pseudopolyps and ulcerations in her  colon.  She, in the past on reviewing her chart, has had rather good  response to Colazal therapy and local Analpram cream.   Most of her complaints now are abdominal cramping and diarrhea without  rectal bleeding, any upper GI or hepatobiliary complaints except for  acute episodes of upper abdominal pain over the last few months.  I  cannot see where she has had ultrasound of the abdomen done in some  time.  Her appetite is poor and she is slowly losing weight.  She denies  fever, chills, skin rashes, but has had a mouth sore on the roof of her  mouth which has had to be biopsied.  She denies any current  cardiovascular complaints, genitourinary problems.   REVIEW OF SYSTEMS:  Otherwise noncontributory.   It is of note that her daughter has severe fistulizing Crohn's disease.   PHYSICAL EXAMINATION:  GENERAL APPEARANCE:  She is a sick-appearing  white female appearing much older than her stated age.  VITAL SIGNS:  She weighs 117 pounds, which is down some 10 pounds from  her normal weight.  Blood pressure is 112/62 and pulse 68 and regular.  Could not appreciate stigmata of chronic liver disease.  CHEST:  Clear and there were no murmurs, rubs, or gallops.  CARDIOVASCULAR:  She was in a regular rhythm.  ABDOMEN:  Surprisingly, there was no hepatosplenomegaly, abdominal  masses, tenderness or distention.  RECTAL:  Inspection of rectum showed what appeared to be a posterior  fissure which is very tender to touch with some perirectal erythema.  Rectal  exam was not performed at her request.   ASSESSMENT:  Mrs. Sturgeon has chronic inflammatory bowel disease  (Crohn's colitis) and has had intolerance in the past to antibiotics  (metronidazole), trial of 6MP therapy.  She has had relatively good  response to past aminosalicylates but has been very poorly compliant  about her taking these medications and her follow-up.   RECOMMENDATIONS:  1. Screening laboratory parameters.  2. Colonoscopy exam.  3. Consider upper abdominal ultrasound exam to rule out gallstones.  4. Have instituted Lialda 2.4 mg a day and also empirically placed her      Cipro 500 mg twice a day.  5. Local AnaMantle cream to repair rectal area.  6. Further work-up depending on clinical course and work-up to date.     Loralee Pacas. Sharlett Iles, MD, [facility], [employer]  Electronically Signed    DRP/MedQ  DD: 07/05/2006  DT: 07/05/2006  Job #: [id number]   cc:   Lew Dawes. Rosana Hoes, M.D.

## 2010-09-30 NOTE — Assessment & Plan Note (Signed)
[facility] OFFICE NOTE   CRESCENTIA, BOUTWELL                 MRN:          [id number]  DATE:09/11/2006                            DOB:          [birth year]    Lauren Figueroa is on Humira injections every other week after induction  therapy.  She feels much better symptomatically and is not having  diarrhea, but does have some bloating and discomfort in her lower  abdomen which is very nonspecific in nature.  She has had no problems  with her injections and she also has received B-12 replacement therapy  parenterally because of her B-12 deficiency.   Her colonoscopy on July 16, 2006 showed diffuse colonic disease with a  patent anastomosis.  There were multiple pseudo polyps present with some  ulcerations.  Biopsy showed no evidence of dysplasia.  Lab data  otherwise was fairly unremarkable except for low albumin of 3.3 grams  percent.  She also was mildly iron deficient.  Sed rate was not done and  I have sent her by the lab to check a followup sed rate.   She is awake and alert and appears in no distress.  She weighs 117  pounds, her blood pressure is 102/72 and pulse was 60 and regular.  ABDOMINAL:  Remarkable for some distention in the lower quadrants with  what appear to be palpable loops of bowel with some stool.  There is no  definite mass or any significant tenderness.  Bowel sounds were fairly  normal.   ASSESSMENT:  [name] is on Humira immunosuppressive anti-tumor  necrosis factor therapy for the first time and hopefully we have found a  medication that she can tolerate.  She seems to be doing well  symptomatically.   RECOMMENDATIONS:  I have asked her to continue her Humira every 2 weeks  and we will repeat her blood work today and sed rate.  She is to see me  again in 2 month's time or p.r.n. as needed.     Loralee Pacas. Sharlett Iles, MD, [facility], [place]  Electronically Signed    DRP/MedQ   DD: 09/11/2006  DT: 09/11/2006  Job #: [id number]

## 2011-02-22 LAB — BASIC METABOLIC PANEL
BUN: 13
BUN: 8
CO2: 26
CO2: 28
Calcium: 9.2
Chloride: 102
Chloride: 107
Creatinine, Ser: 0.58
Creatinine, Ser: 0.64
GFR calc Af Amer: >60
GFR calc non Af Amer: >60
Glucose, Bld: 117 — ABNORMAL HIGH
Glucose, Bld: 96
Potassium: 3.4 — ABNORMAL LOW
Potassium: 3.8
Sodium: 134 — ABNORMAL LOW

## 2011-02-22 LAB — COMPREHENSIVE METABOLIC PANEL
ALT: 91 — ABNORMAL HIGH
ALT: 93 — ABNORMAL HIGH
AST: 71 — ABNORMAL HIGH
AST: 77 — ABNORMAL HIGH
AST: 94 — ABNORMAL HIGH
Albumin: 3.4 — ABNORMAL LOW
Alkaline Phosphatase: 61
Alkaline Phosphatase: 68
Alkaline Phosphatase: 69
BUN: 13
BUN: 3 — ABNORMAL LOW
CO2: 26
CO2: 26
CO2: 26
Chloride: 104
Chloride: 106
Chloride: 106
Chloride: 107
Creatinine, Ser: 0.65
Creatinine, Ser: 0.7
GFR calc Af Amer: >60
GFR calc Af Amer: >60
GFR calc non Af Amer: >60
GFR calc non Af Amer: >60
GFR calc non Af Amer: >60
Glucose, Bld: 114 — ABNORMAL HIGH
Potassium: 3.7
Potassium: 3.8
Potassium: 3.8
Sodium: 138
Sodium: 140
Total Bilirubin: 0.5
Total Bilirubin: 0.5
Total Bilirubin: 0.5
Total Bilirubin: 0.5
Total Protein: 7.3

## 2011-02-22 LAB — CLOSTRIDIUM DIFFICILE EIA: C difficile Toxins A+B, EIA: NEGATIVE

## 2011-02-22 LAB — CBC
HCT: 31.1 — ABNORMAL LOW
HCT: 32.1 — ABNORMAL LOW
HCT: 32.9 — ABNORMAL LOW
Hemoglobin: 10.7 — ABNORMAL LOW
Hemoglobin: 11.2 — ABNORMAL LOW
Hemoglobin: 11.4 — ABNORMAL LOW
Hemoglobin: 11.9 — ABNORMAL LOW
MCHC: 34.5
MCHC: 35
MCV: 90.2
MCV: 90.4
MCV: 90.7
MCV: 91.4
Platelets: 253
Platelets: 355
RBC: 3.56 — ABNORMAL LOW
RBC: 3.6 — ABNORMAL LOW
RBC: 3.69 — ABNORMAL LOW
RDW: 13.2
RDW: 13.7
WBC: 3.4 — ABNORMAL LOW
WBC: 3.8 — ABNORMAL LOW
WBC: 4.2

## 2011-02-22 LAB — MAGNESIUM
Magnesium: 1.8
Magnesium: 2

## 2011-02-22 LAB — URINALYSIS, ROUTINE W REFLEX MICROSCOPIC
Bilirubin Urine: NEGATIVE
Glucose, UA: NEGATIVE
Hgb urine dipstick: NEGATIVE
Ketones, ur: NEGATIVE
Protein, ur: NEGATIVE
pH: 6.5

## 2011-02-22 LAB — DIFFERENTIAL
Basophils Absolute: 0.1
Basophils Absolute: 0.1
Basophils Relative: 1
Basophils Relative: 2 — ABNORMAL HIGH
Eosinophils Absolute: 0.1
Eosinophils Absolute: 0.3
Eosinophils Relative: 7 — ABNORMAL HIGH
Lymphocytes Relative: 38
Lymphs Abs: 1.8
Monocytes Absolute: 0.5
Monocytes Relative: 11
Neutro Abs: 1.6 — ABNORMAL LOW

## 2011-02-22 LAB — PHOSPHORUS
Phosphorus: 4
Phosphorus: 4.1
Phosphorus: 5.1 — ABNORMAL HIGH

## 2011-04-04 ENCOUNTER — Other Ambulatory Visit: Payer: Self-pay | Admitting: Gastroenterology

## 2011-04-05 NOTE — Telephone Encounter (Signed)
Notified pt per Dr Sharlett Iles, we are refilling the Analpram; pt stated understanding.

## 2011-09-08 ENCOUNTER — Other Ambulatory Visit: Payer: Self-pay | Admitting: Obstetrics and Gynecology

## 2012-08-04 ENCOUNTER — Encounter (HOSPITAL_COMMUNITY): Payer: Self-pay | Admitting: Emergency Medicine

## 2012-08-04 ENCOUNTER — Emergency Department (HOSPITAL_COMMUNITY)
Admission: EM | Admit: 2012-08-04 | Discharge: 2012-08-04 | Disposition: A | Discharge status: Home / Self Care | Payer: BC Managed Care – PPO | Attending: Emergency Medicine | Admitting: Emergency Medicine

## 2012-08-04 DIAGNOSIS — X58XXXA Exposure to other specified factors, initial encounter: Secondary | ICD-10-CM | POA: Insufficient documentation

## 2012-08-04 DIAGNOSIS — H5710 Ocular pain, unspecified eye: Secondary | ICD-10-CM | POA: Insufficient documentation

## 2012-08-04 DIAGNOSIS — M810 Age-related osteoporosis without current pathological fracture: Secondary | ICD-10-CM | POA: Insufficient documentation

## 2012-08-04 DIAGNOSIS — H53469 Homonymous bilateral field defects, unspecified side: Secondary | ICD-10-CM | POA: Insufficient documentation

## 2012-08-04 DIAGNOSIS — K5090 Crohn's disease, unspecified, without complications: Secondary | ICD-10-CM | POA: Insufficient documentation

## 2012-08-04 DIAGNOSIS — Y929 Unspecified place or not applicable: Secondary | ICD-10-CM | POA: Insufficient documentation

## 2012-08-04 DIAGNOSIS — Z87891 Personal history of nicotine dependence: Secondary | ICD-10-CM | POA: Insufficient documentation

## 2012-08-04 DIAGNOSIS — H539 Unspecified visual disturbance: Secondary | ICD-10-CM | POA: Insufficient documentation

## 2012-08-04 DIAGNOSIS — Y939 Activity, unspecified: Secondary | ICD-10-CM | POA: Insufficient documentation

## 2012-08-04 DIAGNOSIS — K632 Fistula of intestine: Secondary | ICD-10-CM | POA: Insufficient documentation

## 2012-08-04 DIAGNOSIS — S058X9A Other injuries of unspecified eye and orbit, initial encounter: Secondary | ICD-10-CM | POA: Insufficient documentation

## 2012-08-04 DIAGNOSIS — K649 Unspecified hemorrhoids: Secondary | ICD-10-CM | POA: Insufficient documentation

## 2012-08-04 DIAGNOSIS — Z8719 Personal history of other diseases of the digestive system: Secondary | ICD-10-CM | POA: Insufficient documentation

## 2012-08-04 MED ORDER — ERYTHROMYCIN 5 MG/GM OP OINT
TOPICAL_OINTMENT | Freq: Four times a day (QID) | OPHTHALMIC | Status: DC
  Filled 2012-08-04: qty 3.5

## 2012-08-04 MED ORDER — TETRACAINE HCL 0.5 % OP SOLN
1.0000 [drp] | Freq: Once | OPHTHALMIC | Status: AC
  Administered 2012-08-04: 1 [drp] via OPHTHALMIC
  Filled 2012-08-04: qty 2

## 2012-08-04 NOTE — ED Notes (Signed)
MD at bedside. 

## 2012-08-04 NOTE — ED Notes (Signed)
With prescription glasses

## 2012-08-04 NOTE — ED Provider Notes (Signed)
History     CSN: [id number]  Arrival date & time 08/04/12  1204   First MD Initiated Contact with Patient 08/04/12 1349      Chief Complaint  Patient presents with  . Eye Problem    (Consider location/radiation/quality/duration/timing/severity/associated sxs/prior treatment) HPI  The patient presents with concern of visual disturbance in the right eye.  She notes that earlier today, approximately 8 hours ago she suddenly became aware of a diffuse spider web like disruption of her entire right visual fields.  No left visual complaints.  The sensation eased after approximately 15 minutes, but has recurred multiple times since the initial event.  Subsequently the patient has a right hemianopsia with floaters, intermittent lines.  No complete visual loss anywhere.  No headache, no confusion or disorientation. She states that just prior to arrival the patient also notes mild burning sensation in the eye.  Past Medical History  Diagnosis Date  . Unspecified intestinal obstruction   . Unspecified hemorrhoids without mention of complication   . Osteoporosis, unspecified   . Fistula of intestine, excluding rectum and anus   . Regional enteritis of unspecified site     Past Surgical History  Procedure Laterality Date  . Appendectomy    . Pelvic abcess drained    . Tubal ligation      Family History  Problem Relation Age of Onset  . Diabetes    . Heart disease    . Colon cancer    . Lung cancer Father   . Lung cancer Mother     History  Substance Use Topics  . Smoking status: Former Research scientist (life sciences)  . Smokeless tobacco: Never Used  . Alcohol Use: No    OB History   Grav Para Term Preterm Abortions TAB SAB Ect Mult Living                  Review of Systems  Constitutional:       Per HPI, otherwise negative  HENT: Negative for hearing loss, ear pain, nosebleeds, congestion, neck pain and neck stiffness.   Eyes: Positive for pain and visual disturbance. Negative for  photophobia, discharge, redness and itching.  Respiratory:       Per HPI, otherwise negative  Cardiovascular:       Per HPI, otherwise negative  Gastrointestinal: Negative for vomiting.  Endocrine:       Negative aside from HPI  Genitourinary:       Neg aside from HPI   Musculoskeletal:       Per HPI, otherwise negative  Skin: Negative.   Neurological: Negative for dizziness, tremors, seizures, syncope, facial asymmetry, speech difficulty, weakness, light-headedness, numbness and headaches.    Allergies  Ciprofloxacin; Codeine; Metronidazole; Penicillins; and Sulfa antibiotics  Home Medications   Current Outpatient Rx  Name  Route  Sig  Dispense  Refill  . acetaminophen (TYLENOL) 325 MG tablet   Oral   Take 650 mg by mouth every 6 (six) hours as needed (for leep).         . hydrocortisone-pramoxine (ANALPRAM-HC) 2.5-1 % rectal cream      Apply to rectal area as directed   30 g   3   . Multiple Vitamin (MULTIVITAMIN) tablet   Oral   Take 1 tablet by mouth daily.           Marland Kitchen neomycin-bacitracin-polymyxin (NEOSPORIN) [phone] ointment   Topical   Apply 1 application topically 2 (two) times daily as needed. Apply rectally  BP 162/72  Pulse 68  Temp(Src) 98.1 F (36.7 C) (Oral)  Resp 18  SpO2 98%  Physical Exam  Nursing note and vitals reviewed. Constitutional: She is oriented to person, place, and time. She appears well-developed and well-nourished. No distress.  HENT:  Head: Normocephalic and atraumatic.  Eyes: No foreign bodies found. Right eye exhibits no chemosis, no discharge and no exudate. No foreign body present in the right eye. Left eye exhibits no chemosis, no discharge and no exudate. No foreign body present in the left eye. Right conjunctiva is not injected. Left conjunctiva is not injected. Right eye exhibits normal extraocular motion and no nystagmus. Left eye exhibits normal extraocular motion and no nystagmus. Right pupil is round  and reactive. Left pupil is round and reactive.  Fundoscopic exam:      The right eye shows no hemorrhage and no papilledema.  Slit lamp exam:      The right eye shows corneal abrasion and fluorescein uptake. The right eye shows no corneal flare, no corneal ulcer, no foreign body, no hyphema, no hypopyon and no anterior chamber bulge.  Cardiovascular: Normal rate and regular rhythm.   Pulmonary/Chest: Effort normal and breath sounds normal. No stridor. No respiratory distress.  Abdominal: She exhibits no distension.  Musculoskeletal: She exhibits no edema.  Neurological: She is alert and oriented to person, place, and time. No cranial nerve deficit.  Skin: Skin is warm and dry.  Psychiatric: She has a normal mood and affect.    ED Course  [place] bedside Date/Time: 08/04/2012 1:30 PM Performed by: Carmin Muskrat Authorized by: Carmin Muskrat Consent: Verbal consent obtained. Risks and benefits: risks, benefits and alternatives were discussed Consent given by: patient Patient understanding: patient does not state understanding of the procedure being performed Patient consent: the patient's understanding of the procedure does not match consent given Procedure consent: procedure consent does not match procedure scheduled Relevant documents: relevant documents not present or verified Test results: test results not available Site marked: the operative site was not marked Imaging studies: imaging studies not available Time out: Immediately prior to procedure a "time out" was called to verify the correct patient, procedure, equipment, support staff and site/side marked as required. Preparation: Patient was prepped and draped in the usual sterile fashion. Local anesthesia used: no Patient sedated: no Patient tolerance: Patient tolerated the procedure well with no immediate complications. Comments: BS [place] for retinal detachment was negative.  No free bodies, lens is intact   (including  critical care time)  Labs Reviewed - No data to display No results found.   1. Visual disturbance of one eye    Update: I discussed the case with Dr. Tyrone Schimke - offers to see the patient in clinic in the morning.   MDM  This patient presents with right eye visual disturbance.  Her description of intermittent symptoms, with largely limited to the right lateral visual field, the mostly absent pain, the absence of distress, and preserved visual acuity is reassuring for the low suspicion of retinal detachment or other acute process requiring emergent intervention.  The patient's presentation is most consistent with a vitreous hemorrhage, although there is mild evidence of corneal abrasion.  This may be secondary to the patient's reacting to the visual field changes, and there is no ulceration or significant abrasion.  After discussing the case with our ophthalmologist on call, the patient had expedited followup arranged.  We discussed this, the patient was discharged in stable condition with instructions, as well as return  precautions.      Carmin Muskrat, MD 08/04/12 1555

## 2012-08-04 NOTE — ED Notes (Signed)
Pt states at 0830 this AM she started having vision changes in her right eye. States she saw "webs" everywhere she looked and eye was burning, lasted for approx 20 minutes. Now seeing floaters. Pt sent from urgent care .

## 2013-05-27 ENCOUNTER — Other Ambulatory Visit (INDEPENDENT_AMBULATORY_CARE_PROVIDER_SITE_OTHER): Payer: BC Managed Care – PPO

## 2013-05-27 ENCOUNTER — Encounter: Payer: Self-pay | Admitting: Gastroenterology

## 2013-05-27 ENCOUNTER — Ambulatory Visit (INDEPENDENT_AMBULATORY_CARE_PROVIDER_SITE_OTHER): Payer: BC Managed Care – PPO | Admitting: Gastroenterology

## 2013-05-27 VITALS — BP 140/74 | HR 68 | Ht 63.0 in | Wt 134.4 lb

## 2013-05-27 DIAGNOSIS — K648 Other hemorrhoids: Secondary | ICD-10-CM

## 2013-05-27 DIAGNOSIS — K5090 Crohn's disease, unspecified, without complications: Secondary | ICD-10-CM

## 2013-05-27 DIAGNOSIS — K50913 Crohn's disease, unspecified, with fistula: Secondary | ICD-10-CM

## 2013-05-27 LAB — FOLATE: Folate: >24.8 ng/mL (ref >5.9)

## 2013-05-27 LAB — IBC PANEL
IRON: 57 ug/dL (ref 42–145)
Saturation Ratios: 12.3 % — ABNORMAL LOW (ref 20.0–50.0)
Transferrin: 331.4 mg/dL (ref 212.0–360.0)

## 2013-05-27 LAB — VITAMIN B12: VITAMIN B 12: 420 pg/mL (ref 211–911)

## 2013-05-27 LAB — FERRITIN: Ferritin: 93.8 ng/mL (ref 10.0–291.0)

## 2013-05-27 MED ORDER — HYDROCORTISONE ACE-PRAMOXINE 2.5-1 % RE CREA: TOPICAL_CREAM | RECTAL | Status: DC

## 2013-05-27 NOTE — Progress Notes (Signed)
This is a 63 year old Caucasian female who has had Crohn's disease fistualizing and stenosing type more than 30 years.  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  Abscess was successfully drained and she was taken off Humira and is on no medications for Crohn's disease at this time.  She is doing well and denies abdominal pain, nausea vomiting, anorexia or weight loss.  She does have some problems with hemorrhoid and uses local anal creams.  She recently had pelvic ultrasound showed a "phlegmon" in her pelvic area.  She does not desire CT scan at this time because of her state of remission.  Her main concern is one of possible colon carcinoma and need for colonoscopy which has been difficult as mentioned above.  She's had no nutritional problems, hepatobiliary problems, or systemic complaints.  She has a daughter has severe fistulas and Crohn's disease also.  Current Medications, Allergies, Past Medical History, Past Surgical History, Family History and Social History were reviewed in [employer] record.  ROS: All systems were reviewed and are negative unless otherwise stated in the HPI.          Physical Exam: The appearing patient in no acute distress.  Blood pressure 140/74, pulse 68 and weight 134 with a BMI of 23.81.  Cannot appreciate stigmata of chronic liver disease.  Her abdomen shows a fullness in the left lower quadrant area which is freely movable slightly tender, but there certainly is no rebound tenderness.  Exam otherwise is fairly unremarkable and bowel sounds are nonobstructive in nature.  Chest is clear she is in a regular rhythm without murmurs gallops or rubs.  Specks of rectum shows some external skin tags but no fissures or fistulae.  Digital exam was deferred at her request.    Assessment and  Plan: Chronic Crohn's disease which appears to be in remission at this time with stable fistulae and probable residual phlegmon from previous pelvic abscess.  We have decided to check a COLOGUARD -DNA stool tests to assess colon cancer, we'll repeat her labs including B12 level, I have renewed her Analpram cream, and I will turn her medical care over to Dr. Ardis Hughs at her request.  She is on a daily multivitamin tablet.  She follows a very specialized low roughage diet and has rather marked lactose intolerance.  Previous evaluation for celiac disease been negative.  Her care is been somewhat difficult because of compliance issues, and she will only come to the doctor when she is definitely sick.  If her Cologuard test is positive she will probably need CT colonoscopy and flexible sigmoidoscopy.

## 2013-05-27 NOTE — Patient Instructions (Signed)
Your physician has requested that you go to the basement for the following lab work before leaving today: Anemia Panel   Refill for Analpram was sent to your pharmacy.  You will receive a call from [facility] regarding your Cologuard Stool Test

## 2013-10-21 ENCOUNTER — Other Ambulatory Visit: Payer: Self-pay | Admitting: Family Medicine

## 2013-10-21 ENCOUNTER — Other Ambulatory Visit (HOSPITAL_COMMUNITY)
Admission: RE | Admit: 2013-10-21 | Discharge: 2013-10-21 | Disposition: A | Discharge status: Home / Self Care | Payer: BC Managed Care – PPO | Source: Ambulatory Visit | Attending: Family Medicine | Admitting: Family Medicine

## 2013-10-21 DIAGNOSIS — Z124 Encounter for screening for malignant neoplasm of cervix: Secondary | ICD-10-CM | POA: Insufficient documentation

## 2013-10-21 DIAGNOSIS — Z1151 Encounter for screening for human papillomavirus (HPV): Secondary | ICD-10-CM | POA: Insufficient documentation

## 2013-10-23 LAB — CYTOLOGY - PAP

## 2015-05-16 DIAGNOSIS — N823 Fistula of vagina to large intestine: Secondary | ICD-10-CM

## 2015-05-16 DIAGNOSIS — K632 Fistula of intestine: Secondary | ICD-10-CM

## 2015-05-16 DIAGNOSIS — N321 Vesicointestinal fistula: Secondary | ICD-10-CM

## 2015-05-16 HISTORY — DX: Fistula of intestine: K63.2

## 2015-05-16 HISTORY — DX: Vesicointestinal fistula: N32.1

## 2015-05-16 HISTORY — DX: Fistula of vagina to large intestine: N82.3

## 2015-11-17 ENCOUNTER — Encounter: Payer: Self-pay | Admitting: Internal Medicine

## 2015-11-17 ENCOUNTER — Ambulatory Visit (INDEPENDENT_AMBULATORY_CARE_PROVIDER_SITE_OTHER): Payer: 59 | Admitting: Internal Medicine

## 2015-11-17 ENCOUNTER — Other Ambulatory Visit (INDEPENDENT_AMBULATORY_CARE_PROVIDER_SITE_OTHER): Payer: 59

## 2015-11-17 VITALS — BP 110/60 | HR 60 | Ht 63.0 in | Wt 139.0 lb

## 2015-11-17 DIAGNOSIS — K50113 Crohn's disease of large intestine with fistula: Secondary | ICD-10-CM

## 2015-11-17 DIAGNOSIS — K5090 Crohn's disease, unspecified, without complications: Secondary | ICD-10-CM

## 2015-11-17 LAB — IBC PANEL
Iron: 47 ug/dL (ref 42–145)
Saturation Ratios: 10.7 % — ABNORMAL LOW (ref 20.0–50.0)
Transferrin: 314 mg/dL (ref 212.0–360.0)

## 2015-11-17 LAB — VITAMIN B12: VITAMIN B 12: 525 pg/mL (ref 211–911)

## 2015-11-17 LAB — C-REACTIVE PROTEIN: CRP: 1.2 mg/dL (ref 0.5–20.0)

## 2015-11-17 LAB — FERRITIN: Ferritin: 82 ng/mL (ref 10.0–291.0)

## 2015-11-17 MED ORDER — NA SULFATE-K SULFATE-MG SULF 17.5-3.13-1.6 GM/177ML PO SOLN: ORAL | Status: DC

## 2015-11-17 MED ORDER — HYDROCORTISONE ACE-PRAMOXINE 2.5-1 % RE CREA: TOPICAL_CREAM | RECTAL | Status: DC

## 2015-11-17 NOTE — Patient Instructions (Addendum)
You have been scheduled for a colonoscopy. Please follow written instructions given to you at your visit today.  Please pick up your prep supplies at the pharmacy within the next 1-3 days. If you use inhalers (even only as needed), please bring them with you on the day of your procedure. Your physician has requested that you go to www.startemmi.com and enter the access code given to you at your visit today. This web site gives a general overview about your procedure. However, you should still follow specific instructions given to you by our office regarding your preparation for the procedure.  You have been scheduled for an MR Enterography abdomen/pelvis at [facility] on Monday, 11/22/15. Your appointment time is 3:00 pm. Please arrive at 1:30 pm for registration purposes. Please make certain not to have anything to eat or drink 4 hours prior to your test. In addition, if you have any metal in your body, have a pacemaker or defibrillator, please be sure to let your ordering physician know. This test typically takes 45 minutes to 1 hour to complete.   Your physician has requested that you go to the basement for the following lab work before leaving today: Calprotectin, CRP, B12, IBC, Ferritin  We have sent the following medications to your pharmacy for you to pick up at your convenience: analpram  You have been scheduled for an appointment with Dr Billey Gosling [facility] [place] 1st floor) on 02/28/16 @ 3:00 pm.  If you are age 6 or older, your body mass index should be between 23-30. Your Body mass index is 24.63 kg/(m^2). If this is out of the aforementioned range listed, please consider follow up with your Primary Care Provider.  If you are age 80 or younger, your body mass index should be between 19-25. Your Body mass index is 24.63 kg/(m^2). If this is out of the aformentioned range listed, please consider follow up with your Primary Care Provider.

## 2015-11-17 NOTE — Progress Notes (Signed)
HPI:  Lauren Figueroa is a 65 year old Caucasian female who presents to clinic today for follow-up of her chronic fistulizing Crohn's disease which does appear to have remained in her large bowel according to previous imaging. She does have history of following with Dr. Sharlett Iles for 39 years. Per review of previous charts, the patient was started on Humira in 2008 and did very well until she developed an abdominal phlegmon in 2013 and this was stopped. The patient's last follow-up was in January 2015 and at that time the patient had been off of Humira for 2 years and was doing very well with no symptoms of Crohn's. The patient did complain of hemorrhoid symptoms including itching and discomfort for which she used Analpram cream at home. It was recommended she have a ColoGuard test, which was never completed. Per our records patient's last colonoscopy was attempted in 2008 and was incomplete due to a stricture in the sigmoid colon.  Today, the patient tells me that she continues to do "very well". She does describe that she will have occasional flares of her Crohn's, often if she "eats the wrong thing". Specific triggers for her are dairy and roughage. Patient describes eating cheese pizza 2 days ago and developing abdominal cramps and had several episodes of diarrhea which left her feeling weak and sore all day yesterday, but today she feels very well. Patient describes this is very consistent with her previous "flares" and is always related to diet. Today she only presents to our clinic to establish care.  Patient does ask for a refill of her Analpram today and tells me that she believes she currently has a fissure as she experiences some pain and burning with defecation. This has occurred before and the patient has never tried nitroglycerin or nifedipine ointment and would prefer to stick with what she knows, which is a combination of Analpram and Neosporin.  Patient denies nausea, vomiting, frequent  heartburn or reflux, dysphagia, frequent abdominal pain, symptoms then awaken her at night, fever, chills, blood in her stool, melena or weight loss.   Past Medical History  Diagnosis Date  . Unspecified intestinal obstruction ([place])   . Unspecified hemorrhoids without mention of complication   . Osteoporosis, unspecified   . Fistula of intestine, excluding rectum and anus   . Regional enteritis of unspecified site     Past Surgical History  Procedure Laterality Date  . Appendectomy    . Pelvic abcess drained    . Tubal ligation      Current Outpatient Prescriptions  Medication Sig Dispense Refill  . acetaminophen (TYLENOL) 325 MG tablet Take 650 mg by mouth every 6 (six) hours as needed (for leep).    . cholecalciferol (VITAMIN D) 1000 units tablet Take 1,000 Units by mouth daily.    . hydrocortisone-pramoxine (ANALPRAM HC) 2.5-1 % rectal cream Apply to rectal area as directed 30 g 2  . Multiple Vitamin (MULTIVITAMIN) tablet Take 1 tablet by mouth daily.      Marland Kitchen neomycin-bacitracin-polymyxin (NEOSPORIN) [phone] ointment Apply 1 application topically 2 (two) times daily as needed. Apply rectally     No current facility-administered medications for this visit.    Allergies as of 11/17/2015 - Review Complete 11/17/2015  Allergen Reaction Noted  . Ciprofloxacin  03/14/2007  . Codeine Nausea And Vomiting 08/04/2012  . Metronidazole Nausea And Vomiting   . Penicillins Other (See Comments) 09/20/2007  . Sulfa antibiotics Nausea And Vomiting 08/04/2012    Family History  Problem Relation Age of  Onset  . Diabetes Maternal Grandfather   . Heart disease Father   . Colon cancer Neg Hx   . Lung cancer Father   . Heart disease Mother   . Diabetes Maternal Grandmother   . Diabetes Paternal Grandmother   . Diabetes Paternal Grandfather     Social History   Social History  . Marital Status: Married    Spouse Name: N/A  . Number of Children: 2  . Years of Education: N/A    Occupational History  . receptionist      [employer]   Social History Main Topics  . Smoking status: Former Research scientist (life sciences)  . Smokeless tobacco: Never Used  . Alcohol Use: Yes     Comment: occasional wine  . Drug Use: No  . Sexual Activity: Not on file   Other Topics Concern  . Not on file   Social History Narrative    Review of Systems:    Constitutional: No weight loss or fatigue HEENT: Eyes: No change in vision               Ears, Nose, Throat:  No change in hearing Skin: No rash or itching Cardiovascular: No chest pain or pressure    Respiratory: No SOB or cough Gastrointestinal: See HPI and otherwise negative Genitourinary: No dysuria or change in urinary frequency Neurological: No headache or dizziness Musculoskeletal: No new muscle or joint pain Psychiatric: No history of depression or anxiety Endocrinologic: No reports of sweating, cold or heat intolerance    Physical Exam:  Vital signs: BP 110/60 mmHg  Pulse 60  Ht 5' 3"  (1.6 m)  Wt 139 lb (63.05 kg)  BMI 24.63 kg/m2  General:   Pleasant elderly Caucasian female appears to be in NAD, Well developed, Well nourished, alert and cooperative Head:  Normocephalic and atraumatic. Eyes:   PEERL, EOMI. No icterus. Conjunctiva pink. Ears:  Normal auditory acuity. Neck:  Supple Throat: Oral cavity and pharynx without inflammation, swelling or lesion. Teeth in good condition. Lungs: Respirations even and unlabored. Lungs clear to auscultation bilaterally.   No wheezes, crackles, or rhonchi.  Heart: Normal S1, S2. No MRG. Regular rate and rhythm. No peripheral edema, cyanosis or pallor.  Abdomen:  Soft, nondistended, mild generalized TTP. No rebound or guarding. Normal bowel sounds. No appreciable masses or hepatomegaly. Umbilical hernia easily reducible Rectal:  Not performed.  Msk:  Symmetrical without gross deformities. Peripheral pulses intact.  Extremities:  Without edema, no deformity or joint abnormality.   Neurologic:  Alert and  oriented x4;  grossly normal neurologically.  Skin:   Dry and intact without significant lesions or rashes. Psychiatric: Oriented to person, place and time. Demonstrates good judgement and reason without abnormal affect or behaviors.  RELEVANT LABS AND IMAGING: CBC    Component Value Date/Time   WBC 4.9 09/27/2010 1545   RBC 3.55* 09/27/2010 1545   HGB 11.7* 09/27/2010 1545   HCT 33.3* 09/27/2010 1545   PLT 324.0 09/27/2010 1545   MCV 94.0 09/27/2010 1545   MCHC 35.2 09/27/2010 1545   RDW 13.4 09/27/2010 1545   LYMPHSABS 1.4 09/27/2010 1545   MONOABS 0.4 09/27/2010 1545   EOSABS 0.1 09/27/2010 1545   BASOSABS 0.1 09/27/2010 1545    CMP     Component Value Date/Time   NA 137 09/27/2010 1545   K 3.8 09/27/2010 1545   CL 99 09/27/2010 1545   CO2 28 09/27/2010 1545   GLUCOSE 83 09/27/2010 1545   BUN 8 09/27/2010 1545  CREATININE 0.5 09/27/2010 1545   CALCIUM 9.4 09/27/2010 1545   PROT 7.7 09/27/2010 1545   ALBUMIN 3.7 09/27/2010 1545   AST 23 09/27/2010 1545   ALT 16 09/27/2010 1545   ALKPHOS 106 09/27/2010 1545   BILITOT 0.5 09/27/2010 1545   GFRNONAA 108.55 07/21/2009 1412   GFRAA  11/27/2008 0430    >60        The eGFR has been calculated using the MDRD equation. This calculation has not been validated in all clinical situations. eGFR's persistently <60 mL/min signify possible Chronic Kidney Disease.    Assessment: 1. Chronic Fistulizing Crohn's disease: Currently disease is maintained with diet, patient has not been on medication since 2013, reports some "flares" related to diet, last colonoscopy attempted in 2008-incomplete due to stricture in the sigmoid colon, no recent labs or imaging 2. Hemorrhoids: Patient with chronic hemorrhoid irritation controlled on Analpram  Plan: 1. Ordered CRP, B12 and iron studies 2. Ordered fecal calprotectin 3. Ordered MRI enterography 4. Schedule patient for colonoscopy in early August, pending  results of MRI enterography 5. Patient is to continue abiding by known diet restrictions 6. Refilled Analpram 7. Patient to follow up as directed after time of colonoscopy or sooner if necessary.  Ellouise Newer, PA-C [facility] 11/17/2015, 3:42 PM  I personally saw the patient today with Ellouise Newer, PA-C anticipated in the history of present illness, assessment and plan. Long-standing Crohn's colitis, possible enteritis with history of fistulizing disease. Recently on no therapy but previously on Humira discontinued in 2013 due to the presence of what sounds like an intra-abdominal infection. No recent colonoscopy Some intermittent symptoms which she feels are more diet related I have recommended that we obtain objective evidence of current disease activity starting with MR enterography. This will evaluate small and large bowel and also evaluate for fistula and stricturing. Following imaging of recommended colonoscopy for surveillance. She is high risk for colorectal cancer given her long-standing Crohn's colitis. We discussed this at length today. We discussed the risk, benefits and alternatives to colonoscopy and she is agreeable to proceed We will also order CBC, CMP, CRP, fecal calprotectin B12 and iron studies  45 minutes spent with the patient today. Greater than 50% was spent in counseling and coordination of care with the patient

## 2015-11-18 ENCOUNTER — Other Ambulatory Visit: Payer: Self-pay

## 2015-11-18 ENCOUNTER — Telehealth: Payer: Self-pay | Admitting: Internal Medicine

## 2015-11-18 DIAGNOSIS — K50919 Crohn's disease, unspecified, with unspecified complications: Secondary | ICD-10-CM

## 2015-11-18 NOTE — Telephone Encounter (Signed)
Pt was seen yesterday and an MRE was ordered for Monday. Pt would like to discuss with Selvy Malta if this is really necessary. States she has a high out of pocket amt she will have to pay and if she really needs it she will. Has questions and would like to speak with Ruddick Malta. Note sent to [facility].

## 2015-11-19 ENCOUNTER — Other Ambulatory Visit (INDEPENDENT_AMBULATORY_CARE_PROVIDER_SITE_OTHER): Payer: 59

## 2015-11-19 ENCOUNTER — Other Ambulatory Visit: Payer: Self-pay

## 2015-11-19 DIAGNOSIS — K50919 Crohn's disease, unspecified, with unspecified complications: Secondary | ICD-10-CM | POA: Diagnosis not present

## 2015-11-19 LAB — CBC WITH DIFFERENTIAL/PLATELET
BASOS ABS: 0.1 10*3/uL (ref 0.0–0.1)
BASOS PCT: 1 % (ref 0.0–3.0)
EOS ABS: 0.1 10*3/uL (ref 0.0–0.7)
Eosinophils Relative: 2.3 % (ref 0.0–5.0)
HCT: 33.6 % — ABNORMAL LOW (ref 36.0–46.0)
HEMOGLOBIN: 11.5 g/dL — AB (ref 12.0–15.0)
Lymphocytes Relative: 37 % (ref 12.0–46.0)
Lymphs Abs: 2 10*3/uL (ref 0.7–4.0)
MCHC: 34.3 g/dL (ref 30.0–36.0)
MCV: 84.3 fl (ref 78.0–100.0)
MONO ABS: 0.5 10*3/uL (ref 0.1–1.0)
Monocytes Relative: 8.9 % (ref 3.0–12.0)
NEUTROS ABS: 2.7 10*3/uL (ref 1.4–7.7)
NEUTROS PCT: 50.8 % (ref 43.0–77.0)
Platelets: 293 10*3/uL (ref 150.0–400.0)
RBC: 3.99 Mil/uL (ref 3.87–5.11)
RDW: 14.9 % (ref 11.5–15.5)
WBC: 5.3 10*3/uL (ref 4.0–10.5)

## 2015-11-19 LAB — COMPREHENSIVE METABOLIC PANEL
ALBUMIN: 4.2 g/dL (ref 3.5–5.2)
ALK PHOS: 144 U/L — AB (ref 39–117)
ALT: 16 U/L (ref 0–35)
AST: 16 U/L (ref 0–37)
BUN: 13 mg/dL (ref 6–23)
CO2: 30 mEq/L (ref 19–32)
CREATININE: 0.61 mg/dL (ref 0.40–1.20)
Calcium: 9.5 mg/dL (ref 8.4–10.5)
Chloride: 103 mEq/L (ref 96–112)
GFR: 104.34 mL/min (ref >60.00)
GLUCOSE: 91 mg/dL (ref 70–99)
POTASSIUM: 4 meq/L (ref 3.5–5.1)
SODIUM: 139 meq/L (ref 135–145)
TOTAL PROTEIN: 7.9 g/dL (ref 6.0–8.3)
Total Bilirubin: 0.4 mg/dL (ref 0.2–1.2)

## 2015-11-19 NOTE — Telephone Encounter (Signed)
Spoke with pt and she is ok with proceeding with MRE. Appt left as scheduled and pt aware.

## 2015-11-19 NOTE — Telephone Encounter (Signed)
Called pt [phone], no answer, LMOM to call back when she has time to discuss MRE. Please save to chart and if I am available at time she calls next, please let me know. Thanks-JLL

## 2015-11-22 ENCOUNTER — Ambulatory Visit (HOSPITAL_COMMUNITY)
Admission: RE | Admit: 2015-11-22 | Discharge: 2015-11-22 | Disposition: A | Discharge status: Home / Self Care | Payer: 59 | Source: Ambulatory Visit | Attending: Internal Medicine | Admitting: Internal Medicine

## 2015-11-22 ENCOUNTER — Telehealth: Payer: Self-pay | Admitting: Internal Medicine

## 2015-11-22 DIAGNOSIS — K5669 Other intestinal obstruction: Secondary | ICD-10-CM | POA: Insufficient documentation

## 2015-11-22 DIAGNOSIS — K50113 Crohn's disease of large intestine with fistula: Secondary | ICD-10-CM

## 2015-11-22 DIAGNOSIS — N329 Bladder disorder, unspecified: Secondary | ICD-10-CM | POA: Diagnosis not present

## 2015-11-22 MED ORDER — GADOBENATE DIMEGLUMINE 529 MG/ML IV SOLN
15.0000 mL | Freq: Once | INTRAVENOUS | Status: AC | PRN
  Administered 2015-11-22: 12 mL via INTRAVENOUS

## 2015-11-22 NOTE — Telephone Encounter (Signed)
See result note.  

## 2015-11-24 ENCOUNTER — Other Ambulatory Visit: Payer: Self-pay

## 2015-11-24 DIAGNOSIS — K50919 Crohn's disease, unspecified, with unspecified complications: Secondary | ICD-10-CM

## 2015-12-17 ENCOUNTER — Encounter: Payer: Self-pay | Admitting: Internal Medicine

## 2015-12-24 ENCOUNTER — Telehealth: Payer: Self-pay | Admitting: Internal Medicine

## 2015-12-24 NOTE — Telephone Encounter (Signed)
Reassured patient that Suprep is a standard prep for colonoscopy.

## 2015-12-30 ENCOUNTER — Encounter: Payer: Self-pay | Admitting: Internal Medicine

## 2015-12-30 ENCOUNTER — Ambulatory Visit (AMBULATORY_SURGERY_CENTER): Payer: 59 | Admitting: Internal Medicine

## 2015-12-30 VITALS — BP 120/65 | HR 63 | Temp 98.0°F | Resp 14 | Ht 63.0 in | Wt 139.0 lb

## 2015-12-30 DIAGNOSIS — K50119 Crohn's disease of large intestine with unspecified complications: Secondary | ICD-10-CM | POA: Diagnosis present

## 2015-12-30 DIAGNOSIS — K5140 Inflammatory polyps of colon without complications: Secondary | ICD-10-CM | POA: Diagnosis not present

## 2015-12-30 DIAGNOSIS — D129 Benign neoplasm of anus and anal canal: Secondary | ICD-10-CM

## 2015-12-30 DIAGNOSIS — D128 Benign neoplasm of rectum: Secondary | ICD-10-CM

## 2015-12-30 DIAGNOSIS — K621 Rectal polyp: Secondary | ICD-10-CM | POA: Diagnosis not present

## 2015-12-30 MED ORDER — SODIUM CHLORIDE 0.9 % IV SOLN: 500.0000 mL | INTRAVENOUS | Status: DC

## 2015-12-30 NOTE — Progress Notes (Signed)
Called to room to assist during endoscopic procedure.  Patient ID and intended procedure confirmed with present staff. Received instructions for my participation in the procedure from the performing physician.  

## 2015-12-30 NOTE — Progress Notes (Signed)
Report to PACU, RN, vss, BBS= Clear.  

## 2015-12-30 NOTE — Op Note (Signed)
[facility] Patient Name: Lauren Figueroa Procedure Date: 12/30/2015 8:39 AM MRN: [id number] Endoscopist: Jerene Bears , MD Age: 65 Referring MD:  Date of Birth: [birth year] Gender: Female Account #: [id number] Procedure:                Colonoscopy Indications:              Last colonoscopy: 2008, Crohn's disease of the                            small bowel and colon (long-standing), Disease                            activity assessment of Crohn's disease of the small                            bowel and colon, Abnormal MR enterography Medicines:                Monitored Anesthesia Care Procedure:                Pre-Anesthesia Assessment:                           - Prior to the procedure, a History and Physical                            was performed, and patient medications and                            allergies were reviewed. The patient's tolerance of                            previous anesthesia was also reviewed. The risks                            and benefits of the procedure and the sedation                            options and risks were discussed with the patient.                            All questions were answered, and informed consent                            was obtained. Prior Anticoagulants: The patient has                            taken no previous anticoagulant or antiplatelet                            agents. ASA Grade Assessment: III - A patient with                            severe systemic disease. After reviewing the risks  and benefits, the patient was deemed in                            satisfactory condition to undergo the procedure.                           After obtaining informed consent, the colonoscope                            was passed under direct vision. Throughout the                            procedure, the patient's blood pressure, pulse, and                            oxygen saturations  were monitored continuously. The                            [id number] ([id number]) was introduced through the anus                            with the intention of advancing to the ileum. The                            scope was advanced to the sigmoid colon before the                            procedure was aborted. Medications were given. The                            colonoscopy was technically difficult and complex                            due to abnormal anatomy. The patient tolerated the                            procedure well. The quality of the bowel                            preparation was good. The rectum was photographed. Scope In: 8:57:01 AM Scope Out: 9:11:53 AM Total Procedure Duration: 0 hours 14 minutes 52 seconds  Findings:                 The perianal exam findings include skin tags and                            mild anal canal stenosis.                           A benign-appearing, intrinsic severe stenosis                            measuring of unknown length was found in the distal  sigmoid colon and was non-traversed. This is most                            likely related to chronic Crohn's disease. Multiple                            biopsies were obtained with cold forceps for                            histology in a targeted manner in the distal                            sigmoid colon.                           Discontinuous areas of nonbleeding ulcerated mucosa                            were present in the rectum and in the recto-sigmoid                            colon (small and superficial ulcerations).                           One diverticulum was found in the recto-sigmoid                            colon.                           A 6 mm polypoid lesion was found in the distal                            rectum approaching the dentate line. The lesion was                            semi-pedunculated. No bleeding was  present. This                            was biopsied with a cold forceps for histology                            (rule out skin tag, pseudopolyp, and adenomatous                            polyp).                           Retroflexion in the rectum was not performed due to                            anatomy. Complications:            No immediate complications. Estimated Blood Loss:     Estimated blood loss was minimal. Impression:               -  Perianal skin tags and mild anal canal stenosis                            found on perianal exam, consistent with previous                            Crohn's disease.                           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                           - Mucosal ulceration consistent with Crohn's                            disease.                           - Diverticulosis in the recto-sigmoid colon.                           - Polypoid lesion in the distal rectum approaching                            the dentate line. Biopsied.                           - Multiple biopsies were obtained in the distal                            sigmoid colon at the stricture. Recommendation:           - Patient has a contact number available for                            emergencies. The signs and symptoms of potential                            delayed complications were discussed with the                            patient. Return to normal activities tomorrow.                            Written discharge instructions were provided to the                            patient.                           - Low fiber diet.                           - Await pathology results.                           -  Avoid ibuprofen, naproxen, or other non-steroidal                            anti-inflammatory drugs.                           - Surgical consultation is recommended given                             fibro-stenosing Crohn's disease, inability to                            survey the entire colon and fistulous disease seen                            recently by MR enterography. Biologic therapy is an                            option, but would not be expected to heal chronic                            strictures or close chronic fistulas.                           - No recommendation at this time regarding repeat                            colonoscopy due to colonic stricture. Further                            colonoscopy to be determined at followup. Jerene Bears, MD 12/30/2015 9:29:06 AM This report has been signed electronically.

## 2015-12-30 NOTE — Patient Instructions (Addendum)
YOU HAD AN ENDOSCOPIC PROCEDURE TODAY AT [facility]:   Refer to the procedure report that was given to you for any specific questions about what was found during the examination.  If the procedure report does not answer your questions, please call your gastroenterologist to clarify.  If you requested that your care partner not be given the details of your procedure findings, then the procedure report has been included in a sealed envelope for you to review at your convenience later.  YOU SHOULD EXPECT: Some feelings of bloating in the abdomen. Passage of more gas than usual.  Walking can help get rid of the air that was put into your GI tract during the procedure and reduce the bloating. If you had a lower endoscopy (such as a colonoscopy or flexible sigmoidoscopy) you may notice spotting of blood in your stool or on the toilet paper. If you underwent a bowel prep for your procedure, you may not have a normal bowel movement for a few days.  Please Note:  You might notice some irritation and congestion in your nose or some drainage.  This is from the oxygen used during your procedure.  There is no need for concern and it should clear up in a day or so.  SYMPTOMS TO REPORT IMMEDIATELY:   Following lower endoscopy (colonoscopy or flexible sigmoidoscopy):  Excessive amounts of blood in the stool  Significant tenderness or worsening of abdominal pains  Swelling of the abdomen that is new, acute  Fever of 100F or higher    For urgent or emergent issues, a gastroenterologist can be reached at any hour by calling [phone].   DIET:  We do recommend a small meal at first, but then you may proceed to your regular diet.  Drink plenty of fluids but you should avoid alcoholic beverages for 24 hours.  ACTIVITY:  You should plan to take it easy for the rest of today and you should NOT DRIVE or use heavy machinery until tomorrow (because of the sedation medicines used during the test).     FOLLOW UP: Our staff will call the number listed on your records the next business day following your procedure to check on you and address any questions or concerns that you may have regarding the information given to you following your procedure. If we do not reach you, we will leave a message.  However, if you are feeling well and you are not experiencing any problems, there is no need to return our call.  We will assume that you have returned to your regular daily activities without incident.  If any biopsies were taken you will be contacted by phone or by letter within the next 1-3 weeks.  Please call us at [phone] if you have not heard about the biopsies in 3 weeks.    SIGNATURES/CONFIDENTIALITY: You and/or your care partner have signed paperwork which will be entered into your electronic medical record.  These signatures attest to the fact that that the information above on your After Visit Summary has been reviewed and is understood.  Full responsibility of the confidentiality of this discharge information lies with you and/or your care-partner.    AWAIT BIOPSY RESULTS   LOW FIBER DIET -HANDOUTS GIVEN TO YOU TODAY  AVOID IBUPROFEN ,NAPROXEN,OR OTHER NSAIDS  SURGICAL CONSULTATION RECOMMENDED

## 2015-12-31 ENCOUNTER — Telehealth: Payer: Self-pay | Admitting: *Deleted

## 2015-12-31 NOTE — Telephone Encounter (Signed)
  Follow up Call-  Call back number 12/30/2015  Post procedure Call Back phone  # [phone]  Permission to leave phone message Yes  Some recent data might be hidden     Patient questions:  Do you have a fever, pain , or abdominal swelling? No. Pain Score  0 *  Have you tolerated food without any problems? Yes.    Have you been able to return to your normal activities? Yes.    Do you have any questions about your discharge instructions: Diet   No. Medications  No. Follow up visit  No.  Do you have questions or concerns about your Care? No.  Actions: * If pain score is 4 or above: No action needed, pain <4.

## 2016-01-03 ENCOUNTER — Telehealth: Payer: Self-pay

## 2016-01-03 NOTE — Telephone Encounter (Signed)
-----   Message from Jerene Bears, MD sent at 12/30/2015 10:10 AM EDT ----- Regarding: [facility] referral Please refer to Dr. Kem Parkinson at [facility] with fistula Will need to take MR enterography on CD with her Thanks [facility]

## 2016-01-03 NOTE — Telephone Encounter (Signed)
Records faxed to Dr. March Rummage office for appt. To [phone] for appt.

## 2016-01-05 NOTE — Telephone Encounter (Signed)
Pt scheduled to see Dr. Sheryn Bison at [facility] 01/13/16@9 :30am. Pt should arrive at 9:15am. Pt to bring photo ID, insurance card and copay. Appt will be at [facility] (cancer center) [phone]. Call placed to radiology regarding MR on disc from July but Tedra Coupe is off, will try again tomorrow to check on disc.

## 2016-01-06 ENCOUNTER — Encounter: Payer: Self-pay | Admitting: Internal Medicine

## 2016-01-07 NOTE — Telephone Encounter (Signed)
Spoke with pt and she is aware of appt. Pt knows to pick up disc at [facility] rad and it is ready.

## 2016-02-28 ENCOUNTER — Ambulatory Visit: Payer: 59 | Admitting: Internal Medicine

## 2016-03-17 ENCOUNTER — Telehealth: Payer: Self-pay | Admitting: Internal Medicine

## 2016-03-21 HISTORY — PX: PERMANENT ILEOSTOMY: SHX6021

## 2016-03-21 HISTORY — PX: SMALL INTESTINE SURGERY: SHX150

## 2016-03-21 HISTORY — PX: TOTAL COLECTOMY: SHX852

## 2016-03-21 HISTORY — PX: ABDOMINOPERINEAL PROCTOCOLECTOMY: SUR8

## 2016-03-21 HISTORY — PX: VAGINAL SEPTUM RESECTION: SHX2644

## 2016-04-12 NOTE — Telephone Encounter (Signed)
Pt just called to thank Doctor. No additional actions needed

## 2018-01-15 ENCOUNTER — Encounter: Payer: Self-pay | Admitting: *Deleted

## 2018-01-29 ENCOUNTER — Ambulatory Visit (INDEPENDENT_AMBULATORY_CARE_PROVIDER_SITE_OTHER): Payer: BLUE CROSS/BLUE SHIELD | Admitting: Internal Medicine

## 2018-01-29 ENCOUNTER — Encounter: Payer: Self-pay | Admitting: Internal Medicine

## 2018-01-29 ENCOUNTER — Encounter (INDEPENDENT_AMBULATORY_CARE_PROVIDER_SITE_OTHER): Payer: Self-pay

## 2018-01-29 VITALS — BP 118/72 | HR 64 | Ht 63.0 in | Wt 143.5 lb

## 2018-01-29 DIAGNOSIS — K50113 Crohn's disease of large intestine with fistula: Secondary | ICD-10-CM | POA: Diagnosis not present

## 2018-01-29 DIAGNOSIS — Z932 Ileostomy status: Secondary | ICD-10-CM | POA: Diagnosis not present

## 2018-01-29 NOTE — Patient Instructions (Signed)
Please follow up with Dr Hilarie Fredrickson as needed.  If you are age 67 or older, your body mass index should be between 23-30. Your Body mass index is 25.42 kg/m. If this is out of the aforementioned range listed, please consider follow up with your Primary Care Provider.  If you are age 76 or younger, your body mass index should be between 19-25. Your Body mass index is 25.42 kg/m. If this is out of the aformentioned range listed, please consider follow up with your Primary Care Provider.

## 2018-01-31 ENCOUNTER — Encounter: Payer: Self-pay | Admitting: Internal Medicine

## 2018-01-31 NOTE — Progress Notes (Signed)
Patient ID: Lauren Figueroa, female   DOB: 1951-01-30, 67 y.o.   MRN: [id number] HPI: Lauren Figueroa is a 67 year old female with a long-standing history of fistulizing Crohn's disease of the colon who is here for follow-up.  She was last seen at the time of colonoscopy in August 2017.  I performed a colonoscopy on 12/30/2015 which showed severe intrinsic stenosis in the distal sigmoid which was not traversed.  This was felt related to chronic Crohn's disease.  There was discontinuous areas of ulcerated mucosa in the rectum and rectosigmoid colon.  I referred her to see Dr. Kem Parkinson at [facility] and she underwent proctocolectomy with end ileostomy on 03/21/2016.  The patient returns just for follow-up and continuity.  She has done wonderfully after surgery.  She has been so happy with the result of the surgery and her ileostomy is functioning well.  She has had no issues with abdominal pain.  She passes no stool or drainage per rectum.  She is work closely with the ostomy nurses at [facility].  No other GI or hepatobiliary complaint today.  Past Medical History:  Diagnosis Date  . Crohn's disease with fistula ([place])   . Fistula of intestine, excluding rectum and anus   . Osteoporosis, unspecified   . Phlegmon   . Unspecified hemorrhoids without mention of complication   . Unspecified intestinal obstruction     Past Surgical History:  Procedure Laterality Date  . APPENDECTOMY    . closure enterocolic fistula  97/58/8325  . RESTORATIVE PROCTOCOLECTOMY  03/21/2016   with end ileostomy  . TUBAL LIGATION      Outpatient Medications Prior to Visit  Medication Sig Dispense Refill  . cholecalciferol (VITAMIN D) 1000 units tablet Take 1,000 Units by mouth daily.    . Multiple Vitamin (MULTIVITAMIN) tablet Take 1 tablet by mouth daily.      Marland Kitchen acetaminophen (TYLENOL) 325 MG tablet Take 650 mg by mouth every 6 (six) hours as needed (for leep).    . hydrocortisone-pramoxine (ANALPRAM HC) 2.5-1 %  rectal cream Apply to rectal area as directed 30 g 2  . Na Sulfate-K Sulfate-Mg Sulf 17.5-3.13-1.6 GM/180ML SOLN Suprep-Use as directed 354 mL 0  . neomycin-bacitracin-polymyxin (NEOSPORIN) [id number] ointment Apply 1 application topically 2 (two) times daily as needed. Apply rectally    . 0.9 %  sodium chloride infusion      No facility-administered medications prior to visit.     Allergies  Allergen Reactions  . Ciprofloxacin     REACTION: unspecified  . Codeine Nausea And Vomiting  . Metronidazole Nausea And Vomiting  . Penicillins Other (See Comments)    Crawling out of my skin  . Sulfa Antibiotics Nausea And Vomiting    Family History  Problem Relation Age of Onset  . Diabetes Maternal Grandfather   . Heart disease Father   . Lung cancer Father   . Heart disease Mother   . Diabetes Maternal Grandmother   . Diabetes Paternal Grandmother   . Diabetes Paternal Grandfather   . Crohn's disease Daughter   . Colon cancer Neg Hx     Social History   Tobacco Use  . Smoking status: Former Research scientist (life sciences)  . Smokeless tobacco: Never Used  Substance Use Topics  . Alcohol use: Yes    Comment: occasional wine  . Drug use: No    ROS: As per history of present illness, otherwise negative  BP 118/72   Pulse 64   Ht 5' 3"  (1.6 m)   Wt  143 lb 8 oz (65.1 kg)   BMI 25.42 kg/m  Gen: awake, alert, NAD HEENT: anicteric, op clear CV: RRR, no mrg Pulm: CTA b/l Abd: soft, NT/ND, +BS throughout, ileostomy present in the right lower quadrant Ext: no c/c/e Neuro: nonfocal  ASSESSMENT/PLAN: 67 year old female with a long-standing history of fistulizing Crohn's disease of the colon who is here for follow-up.  1.  Fistulizing colonic Crohn's disease status post total proctocolectomy with end ileostomy --she is done extremely well after surgery and fortunately the pathology was benign.  She feels so much better than she ever thought she would prior to her surgery.  She never had small bowel  Crohn's and at this point there is been no evidence for Crohn's disease in her small bowel.  Hopefully she has been cured after total proctocolectomy.  She can follow-up with [place] on an as-needed basis.  Her ostomy supplies are provided by [facility], but I would be happy to assist with this if ever necessary.  Follow-up as needed

## 2018-07-03 ENCOUNTER — Emergency Department (HOSPITAL_COMMUNITY): Payer: BLUE CROSS/BLUE SHIELD

## 2018-07-03 ENCOUNTER — Other Ambulatory Visit: Payer: Self-pay

## 2018-07-03 ENCOUNTER — Inpatient Hospital Stay (HOSPITAL_COMMUNITY)
Admission: EM | Admit: 2018-07-03 | Discharge: 2018-07-06 | DRG: 394 | Disposition: A | Discharge status: Home / Self Care | Payer: BLUE CROSS/BLUE SHIELD | Attending: Student | Admitting: Student

## 2018-07-03 ENCOUNTER — Encounter (HOSPITAL_COMMUNITY): Payer: Self-pay | Admitting: Emergency Medicine

## 2018-07-03 ENCOUNTER — Telehealth: Payer: Self-pay | Admitting: Internal Medicine

## 2018-07-03 DIAGNOSIS — Z8379 Family history of other diseases of the digestive system: Secondary | ICD-10-CM

## 2018-07-03 DIAGNOSIS — Z888 Allergy status to other drugs, medicaments and biological substances status: Secondary | ICD-10-CM | POA: Diagnosis not present

## 2018-07-03 DIAGNOSIS — Z880 Allergy status to penicillin: Secondary | ICD-10-CM

## 2018-07-03 DIAGNOSIS — Z9049 Acquired absence of other specified parts of digestive tract: Secondary | ICD-10-CM

## 2018-07-03 DIAGNOSIS — Z5320 Procedure and treatment not carried out because of patient's decision for unspecified reasons: Secondary | ICD-10-CM | POA: Diagnosis present

## 2018-07-03 DIAGNOSIS — R112 Nausea with vomiting, unspecified: Secondary | ICD-10-CM | POA: Diagnosis not present

## 2018-07-03 DIAGNOSIS — M810 Age-related osteoporosis without current pathological fracture: Secondary | ICD-10-CM | POA: Diagnosis present

## 2018-07-03 DIAGNOSIS — Z79899 Other long term (current) drug therapy: Secondary | ICD-10-CM | POA: Diagnosis not present

## 2018-07-03 DIAGNOSIS — K56609 Unspecified intestinal obstruction, unspecified as to partial versus complete obstruction: Secondary | ICD-10-CM

## 2018-07-03 DIAGNOSIS — K50113 Crohn's disease of large intestine with fistula: Secondary | ICD-10-CM

## 2018-07-03 DIAGNOSIS — Z0189 Encounter for other specified special examinations: Secondary | ICD-10-CM

## 2018-07-03 DIAGNOSIS — Z882 Allergy status to sulfonamides status: Secondary | ICD-10-CM

## 2018-07-03 DIAGNOSIS — K435 Parastomal hernia without obstruction or  gangrene: Secondary | ICD-10-CM | POA: Insufficient documentation

## 2018-07-03 DIAGNOSIS — Z87891 Personal history of nicotine dependence: Secondary | ICD-10-CM | POA: Diagnosis not present

## 2018-07-03 DIAGNOSIS — E876 Hypokalemia: Secondary | ICD-10-CM | POA: Diagnosis not present

## 2018-07-03 DIAGNOSIS — Z932 Ileostomy status: Secondary | ICD-10-CM | POA: Diagnosis not present

## 2018-07-03 DIAGNOSIS — K50913 Crohn's disease, unspecified, with fistula: Secondary | ICD-10-CM | POA: Diagnosis present

## 2018-07-03 DIAGNOSIS — K56699 Other intestinal obstruction unspecified as to partial versus complete obstruction: Secondary | ICD-10-CM

## 2018-07-03 DIAGNOSIS — Z885 Allergy status to narcotic agent status: Secondary | ICD-10-CM

## 2018-07-03 DIAGNOSIS — K433 Parastomal hernia with obstruction, without gangrene: Secondary | ICD-10-CM | POA: Diagnosis not present

## 2018-07-03 DIAGNOSIS — K5090 Crohn's disease, unspecified, without complications: Secondary | ICD-10-CM | POA: Diagnosis present

## 2018-07-03 DIAGNOSIS — Z8719 Personal history of other diseases of the digestive system: Secondary | ICD-10-CM

## 2018-07-03 DIAGNOSIS — K56600 Partial intestinal obstruction, unspecified as to cause: Secondary | ICD-10-CM

## 2018-07-03 HISTORY — DX: Cutaneous abscess of trunk, unspecified: L02.219

## 2018-07-03 HISTORY — DX: Other intestinal obstruction unspecified as to partial versus complete obstruction: K56.699

## 2018-07-03 HISTORY — DX: Cellulitis of trunk, unspecified: L03.319

## 2018-07-03 HISTORY — DX: Crohn's disease of large intestine with fistula: K50.113

## 2018-07-03 HISTORY — DX: Stenosis of anus and rectum: K62.4

## 2018-07-03 HISTORY — DX: Fistula of vagina to large intestine: N82.3

## 2018-07-03 HISTORY — DX: Vesicointestinal fistula: N32.1

## 2018-07-03 HISTORY — DX: Fistula of intestine: K63.2

## 2018-07-03 LAB — COMPREHENSIVE METABOLIC PANEL
ALK PHOS: 109 U/L (ref 38–126)
ALT: 24 U/L (ref 0–44)
AST: 27 U/L (ref 15–41)
Albumin: 4.6 g/dL (ref 3.5–5.0)
Anion gap: 10 (ref 5–15)
BILIRUBIN TOTAL: 0.9 mg/dL (ref 0.3–1.2)
BUN: 10 mg/dL (ref 8–23)
CALCIUM: 9.3 mg/dL (ref 8.9–10.3)
CO2: 26 mmol/L (ref 22–32)
CREATININE: 0.71 mg/dL (ref 0.44–1.00)
Chloride: 101 mmol/L (ref 98–111)
GFR calc non Af Amer: >60 mL/min (ref >60)
Glucose, Bld: 125 mg/dL — ABNORMAL HIGH (ref 70–99)
Potassium: 3.6 mmol/L (ref 3.5–5.1)
Sodium: 137 mmol/L (ref 135–145)
TOTAL PROTEIN: 8 g/dL (ref 6.5–8.1)

## 2018-07-03 LAB — CBC
HCT: 38.4 % (ref 36.0–46.0)
Hemoglobin: 12.6 g/dL (ref 12.0–15.0)
MCH: 29.9 pg (ref 26.0–34.0)
MCHC: 32.8 g/dL (ref 30.0–36.0)
MCV: 91.2 fL (ref 80.0–100.0)
NRBC: 0 % (ref 0.0–0.2)
PLATELETS: 294 10*3/uL (ref 150–400)
RBC: 4.21 MIL/uL (ref 3.87–5.11)
RDW: 13.3 % (ref 11.5–15.5)
WBC: 8.3 10*3/uL (ref 4.0–10.5)

## 2018-07-03 LAB — LIPASE, BLOOD: LIPASE: 38 U/L (ref 11–51)

## 2018-07-03 MED ORDER — LIP MEDEX EX OINT
1.0000 "application " | TOPICAL_OINTMENT | Freq: Two times a day (BID) | CUTANEOUS | Status: DC
  Administered 2018-07-03 – 2018-07-06 (×5): 1 via TOPICAL
  Filled 2018-07-03 (×4): qty 7

## 2018-07-03 MED ORDER — MORPHINE SULFATE (PF) 4 MG/ML IV SOLN: 4.0000 mg | INTRAVENOUS | Status: DC | PRN

## 2018-07-03 MED ORDER — DIATRIZOATE MEGLUMINE & SODIUM 66-10 % PO SOLN
90.0000 mL | Freq: Once | ORAL | Status: AC
  Administered 2018-07-04: 90 mL via NASOGASTRIC
  Filled 2018-07-03: qty 90

## 2018-07-03 MED ORDER — ENOXAPARIN SODIUM 40 MG/0.4ML ~~LOC~~ SOLN
40.0000 mg | SUBCUTANEOUS | Status: DC
  Administered 2018-07-03 – 2018-07-05 (×3): 40 mg via SUBCUTANEOUS
  Filled 2018-07-03 (×4): qty 0.4

## 2018-07-03 MED ORDER — MAGIC MOUTHWASH
15.0000 mL | Freq: Four times a day (QID) | ORAL | Status: DC | PRN
  Filled 2018-07-03: qty 15

## 2018-07-03 MED ORDER — KETOROLAC TROMETHAMINE 15 MG/ML IJ SOLN
15.0000 mg | Freq: Once | INTRAMUSCULAR | Status: AC
  Administered 2018-07-03: 15 mg via INTRAVENOUS

## 2018-07-03 MED ORDER — KETOROLAC TROMETHAMINE 60 MG/2ML IM SOLN
30.0000 mg | Freq: Once | INTRAMUSCULAR | Status: DC
  Filled 2018-07-03: qty 2

## 2018-07-03 MED ORDER — PROCHLORPERAZINE EDISYLATE 10 MG/2ML IJ SOLN
5.0000 mg | INTRAMUSCULAR | Status: DC | PRN
  Administered 2018-07-04: 10 mg via INTRAVENOUS
  Filled 2018-07-03: qty 2

## 2018-07-03 MED ORDER — METHOCARBAMOL 1000 MG/10ML IJ SOLN
1000.0000 mg | Freq: Four times a day (QID) | INTRAVENOUS | Status: DC | PRN
  Filled 2018-07-03 (×2): qty 10

## 2018-07-03 MED ORDER — ONDANSETRON HCL 4 MG/2ML IJ SOLN
4.0000 mg | Freq: Four times a day (QID) | INTRAMUSCULAR | Status: DC | PRN
  Administered 2018-07-03 – 2018-07-04 (×2): 4 mg via INTRAVENOUS
  Filled 2018-07-03 (×2): qty 2

## 2018-07-03 MED ORDER — ACETAMINOPHEN 325 MG PO TABS
650.0000 mg | ORAL_TABLET | Freq: Four times a day (QID) | ORAL | Status: DC | PRN
  Administered 2018-07-05: 650 mg via ORAL
  Filled 2018-07-03: qty 2

## 2018-07-03 MED ORDER — ONDANSETRON HCL 4 MG/2ML IJ SOLN: 4.0000 mg | Freq: Four times a day (QID) | INTRAMUSCULAR | Status: DC | PRN

## 2018-07-03 MED ORDER — LACTATED RINGERS IV BOLUS: 1000.0000 mL | Freq: Three times a day (TID) | INTRAVENOUS | Status: AC | PRN

## 2018-07-03 MED ORDER — ONDANSETRON HCL 4 MG/2ML IJ SOLN: 4.0000 mg | INTRAMUSCULAR | Status: DC | PRN

## 2018-07-03 MED ORDER — ONDANSETRON HCL 4 MG PO TABS: 4.0000 mg | ORAL_TABLET | Freq: Four times a day (QID) | ORAL | Status: DC | PRN

## 2018-07-03 MED ORDER — LACTATED RINGERS IV BOLUS
1000.0000 mL | Freq: Once | INTRAVENOUS | Status: AC
  Administered 2018-07-03: 1000 mL via INTRAVENOUS

## 2018-07-03 MED ORDER — SODIUM CHLORIDE 0.9 % IV BOLUS
1000.0000 mL | Freq: Once | INTRAVENOUS | Status: AC
  Administered 2018-07-03: 1000 mL via INTRAVENOUS

## 2018-07-03 MED ORDER — SODIUM CHLORIDE 0.9 % IV SOLN
INTRAVENOUS | Status: DC
Start: 2018-07-03 — End: 2018-07-06
  Administered 2018-07-03 – 2018-07-05 (×3): via INTRAVENOUS
  Administered 2018-07-05: 100 mL/h via INTRAVENOUS
  Administered 2018-07-06: 10:00:00 via INTRAVENOUS

## 2018-07-03 MED ORDER — SODIUM CHLORIDE 0.9 % IV SOLN: INTRAVENOUS | Status: DC

## 2018-07-03 MED ORDER — IOPAMIDOL (ISOVUE-300) INJECTION 61%
100.0000 mL | Freq: Once | INTRAVENOUS | Status: AC | PRN
  Administered 2018-07-03: 100 mL via INTRAVENOUS

## 2018-07-03 MED ORDER — SODIUM CHLORIDE (PF) 0.9 % IJ SOLN
INTRAMUSCULAR | Status: AC
  Filled 2018-07-03: qty 50

## 2018-07-03 MED ORDER — IOPAMIDOL (ISOVUE-300) INJECTION 61%
INTRAVENOUS | Status: AC
  Filled 2018-07-03: qty 100

## 2018-07-03 MED ORDER — SODIUM CHLORIDE 0.9 % IV SOLN
8.0000 mg | Freq: Four times a day (QID) | INTRAVENOUS | Status: DC | PRN
  Filled 2018-07-03: qty 4

## 2018-07-03 MED ORDER — ACETAMINOPHEN 650 MG RE SUPP: 650.0000 mg | Freq: Four times a day (QID) | RECTAL | Status: DC | PRN

## 2018-07-03 NOTE — Telephone Encounter (Signed)
Pt called and wanted to speak with nurse she stated that she has not has any activity in her ostomy bag for atleast 24hrs. She is needing some advice.

## 2018-07-03 NOTE — Telephone Encounter (Signed)
Pyrtle Pt had left colon removed in Nov of 2017, hx of crohn's. States since 2pm yesterday she has had no activity from her ostomy and this is unusual for her. She took a dulcolax last night, and 2 this am at 5:30am but it has not done anything. Reports she has no fever but her abd is swollen and she is having abdominal cramping. Pt wants to know what she should do, concerned about having a blockage. As doc of the day please advise.

## 2018-07-03 NOTE — ED Notes (Signed)
Pt refused for NG tube insertion at this time/

## 2018-07-03 NOTE — ED Notes (Signed)
PT.'S HUSBAND CELL NEW CELL NO. TO CALL [phone].

## 2018-07-03 NOTE — ED Triage Notes (Signed)
Pt reports that has ostomy for past 2 years. Reports had no output since yesterday after noon. Having abd pains and nausea.

## 2018-07-03 NOTE — Telephone Encounter (Signed)
Spoke with pt and she is aware.

## 2018-07-03 NOTE — ED Notes (Signed)
Patient transported to CT 

## 2018-07-03 NOTE — ED Provider Notes (Signed)
[facility] DEPT Provider Note   CSN: [id number] Arrival date & time: 07/03/18  1508    History   Chief Complaint Chief Complaint  Patient presents with  . ostomy-no output  . Nausea    HPI Lauren Figueroa is a 68 y.o. female.        Pt was seen at 1635.  Per pt, c/o gradual onset and persistence of constant generalized abd "pain" since yesterday.  Has been associated with multiple intermittent episodes of N/V as well as no stool output from her ostomy since approximately 1400 yesterday. Pt states she was having normal stool output from her ostomy up until that time. Denies fevers, no back pain, no rash, no CP/SOB, no black or blood in stools or emesis.       Past Medical History:  Diagnosis Date  . Crohn's disease with fistula ([place])   . Fistula of intestine, excluding rectum and anus   . Osteoporosis, unspecified   . Phlegmon   . Unspecified hemorrhoids without mention of complication   . Unspecified intestinal obstruction     Patient Active Problem List   Diagnosis Date Noted  . Nonspecific (abnormal) findings on radiological and other examination of body structure 07/13/2009  . ABNORMAL CHEST XRAY 07/13/2009  . INTRAABDOMINAL ABSCESS 08/11/2008  . HEMORRHOIDS 07/02/2007  . CROHN'S DISEASE 07/02/2007  . UNSPECIFIED INTESTINAL OBSTRUCTION 07/02/2007  . FISTULA OF INTESTINE EXCLUDING RECTUM AND ANUS 07/02/2007  . OSTEOPOROSIS 07/02/2007    Past Surgical History:  Procedure Laterality Date  . APPENDECTOMY    . closure enterocolic fistula  19/75/8832  . RESTORATIVE PROCTOCOLECTOMY  03/21/2016   with end ileostomy  . TUBAL LIGATION       OB History   No obstetric history on file.      Home Medications    Prior to Admission medications   Medication Sig Start Date End Date Taking? Authorizing Provider  cholecalciferol (VITAMIN D) 1000 units tablet Take 1,000 Units by mouth daily.    [provider]  Multiple  Vitamin (MULTIVITAMIN) tablet Take 1 tablet by mouth daily.      [provider]    Family History Family History  Problem Relation Age of Onset  . Diabetes Maternal Grandfather   . Heart disease Father   . Lung cancer Father   . Heart disease Mother   . Diabetes Maternal Grandmother   . Diabetes Paternal Grandmother   . Diabetes Paternal Grandfather   . Crohn's disease Daughter   . Colon cancer Neg Hx     Social History Social History   Tobacco Use  . Smoking status: Former Research scientist (life sciences)  . Smokeless tobacco: Never Used  Substance Use Topics  . Alcohol use: Yes    Comment: occasional wine  . Drug use: No     Allergies   Ciprofloxacin; Codeine; Metronidazole; Penicillins; and Sulfa antibiotics   Review of Systems Review of Systems ROS: Statement: All systems negative except as marked or noted in the HPI; Constitutional: Negative for fever and chills. ; ; Eyes: Negative for eye pain, redness and discharge. ; ; ENMT: Negative for ear pain, hoarseness, nasal congestion, sinus pressure and sore throat. ; ; Cardiovascular: Negative for chest pain, palpitations, diaphoresis, dyspnea and peripheral edema. ; ; Respiratory: Negative for cough, wheezing and stridor. ; ; Gastrointestinal: +abd pain, N/V, no stool from ostomy. Negative for diarrhea, blood in stool, hematemesis, jaundice and rectal bleeding.  ; ; Genitourinary: Negative for dysuria, flank pain and hematuria. ; ;  Musculoskeletal: Negative for back pain and neck pain. Negative for swelling and trauma.; ; Skin: Negative for pruritus, rash, abrasions, blisters, bruising and skin lesion.; ; Neuro: Negative for headache, lightheadedness and neck stiffness. Negative for weakness, altered level of consciousness, altered mental status, extremity weakness, paresthesias, involuntary movement, seizure and syncope.       Physical Exam Updated Vital Signs BP (!) 141/76 (BP Location: Right Arm)   Pulse 78   Temp 98.5 F (36.9 C)  (Oral)   Resp 16   SpO2 100%   Physical Exam 1640: Physical examination:  Nursing notes reviewed; Vital signs and O2 SAT reviewed;  Constitutional: Well developed, Well nourished, Well hydrated, In no acute distress; Head:  Normocephalic, atraumatic; Eyes: EOMI, PERRL, No scleral icterus; ENMT: Mouth and pharynx normal, Mucous membranes moist; Neck: Supple, Full range of motion, No lymphadenopathy; Cardiovascular: Regular rate and rhythm, No gallop; Respiratory: Breath sounds clear & equal bilaterally, No wheezes.  Speaking full sentences with ease, Normal respiratory effort/excursion; Chest: Nontender, Movement normal; Abdomen:  +diffuse tenderness to palp, softly distended. +ostomy site pink/moist. Normal bowel sounds; Genitourinary: No CVA tenderness; Extremities: Peripheral pulses normal, No tenderness, No edema, No calf edema or asymmetry.; Neuro: AA&Ox3, Major CN grossly intact.  Speech clear. No gross focal motor or sensory deficits in extremities.; Skin: Color normal, Warm, Dry.   ED Treatments / Results  Labs (all labs ordered are listed, but only abnormal results are displayed)   EKG None  Radiology   Procedures Procedures (including critical care time)  Medications Ordered in ED Medications  0.9 %  sodium chloride infusion (has no administration in time range)  morphine 4 MG/ML injection 4 mg (has no administration in time range)  ondansetron (ZOFRAN) injection 4 mg (has no administration in time range)     Initial Impression / Assessment and Plan / ED Course  I have reviewed the triage vital signs and the nursing notes.  Pertinent labs & imaging results that were available during my care of the patient were reviewed by me and considered in my medical decision making (see chart for details).     MDM Reviewed: previous chart, nursing note and vitals Reviewed previous: labs Interpretation: labs, x-ray and CT scan    Results for orders placed or performed during  the hospital encounter of 07/03/18  Lipase, blood  Result Value Ref Range   Lipase 38 11 - 51 U/L  Comprehensive metabolic panel  Result Value Ref Range   Sodium 137 135 - 145 mmol/L   Potassium 3.6 3.5 - 5.1 mmol/L   Chloride 101 98 - 111 mmol/L   CO2 26 22 - 32 mmol/L   Glucose, Bld 125 (H) 70 - 99 mg/dL   BUN 10 8 - 23 mg/dL   Creatinine, Ser 0.71 0.44 - 1.00 mg/dL   Calcium 9.3 8.9 - 10.3 mg/dL   Total Protein 8.0 6.5 - 8.1 g/dL   Albumin 4.6 3.5 - 5.0 g/dL   AST 27 15 - 41 U/L   ALT 24 0 - 44 U/L   Alkaline Phosphatase 109 38 - 126 U/L   Total Bilirubin 0.9 0.3 - 1.2 mg/dL   GFR calc non Af Amer >60 >60 mL/min   GFR calc Af Amer >60 >60 mL/min   Anion gap 10 5 - 15  CBC  Result Value Ref Range   WBC 8.3 4.0 - 10.5 K/uL   RBC 4.21 3.87 - 5.11 MIL/uL   Hemoglobin 12.6 12.0 - 15.0 g/dL  HCT 38.4 36.0 - 46.0 %   MCV 91.2 80.0 - 100.0 fL   MCH 29.9 26.0 - 34.0 pg   MCHC 32.8 30.0 - 36.0 g/dL   RDW 13.3 11.5 - 15.5 %   Platelets 294 150 - 400 K/uL   nRBC 0.0 0.0 - 0.2 %   Dg Chest 2 View Result Date: 07/03/2018 CLINICAL DATA:  Abdominal pain, nausea and vomiting. Status post left hemicolectomy with colostomy. Decreased activity of the ostomy. EXAM: CHEST - 2 VIEW COMPARISON:  Two-view chest x-ray 07/21/2009 FINDINGS: Heart size is normal. Lungs are clear. The visualized soft tissues and bony thorax are unremarkable. IMPRESSION: Negative two view chest x-ray Electronically Signed   By: San Morelle M.D.   On: 07/03/2018 17:49   Ct Abdomen Pelvis W Contrast Result Date: 07/03/2018 CLINICAL DATA:  68 y/o F; history of Crohn's disease with ostomy placement. No output of the ostomy for 2 days. EXAM: CT ABDOMEN AND PELVIS WITH CONTRAST TECHNIQUE: Multidetector CT imaging of the abdomen and pelvis was performed using the standard protocol following bolus administration of intravenous contrast. CONTRAST:  100 cc Isovue-300 COMPARISON:  07/08/2009 CT abdomen and pelvis.  11/22/2015 MRI of the abdomen. FINDINGS: Lower chest: No acute abnormality. Hepatobiliary: Stable subcentimeter cyst within the dome of the liver. Otherwise no focal liver abnormality is seen. No gallstones, gallbladder wall thickening, or biliary dilatation. Pancreas: Unremarkable. No pancreatic ductal dilatation or surrounding inflammatory changes. Spleen: Normal in size without focal abnormality. Adrenals/Urinary Tract: Adrenal glands are unremarkable. Kidneys are normal, without renal calculi, focal lesion, or hydronephrosis. Bladder is unremarkable. Stomach/Bowel: Right lower quadrant ileostomy. 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 There is mild diffuse distention of small bowel. No bowel wall thickening. Normal appearance of the stomach. Small bowel anastomosis noted in the mid abdomen. Colectomy. Unremarkable appearance of rectal pouch. Vascular/Lymphatic: Aortic atherosclerosis. No enlarged abdominal or pelvic lymph nodes. Reproductive: Status post hysterectomy. No adnexal masses. Other: No abdominal wall hernia or abnormality. No abdominopelvic ascites. Musculoskeletal: No fracture is seen. Mild dextrocurvature of the lumbar spine. Stable L5-S1 grade 1 anterolisthesis with advanced facet arthropathy. IMPRESSION: Right lower quadrant ileostomy. There is fecalization of small bowel contents at the ostomy and mild upstream dilatation of small bowel which may reflect stricture or partial obstruction. No inflammatory changes of the bowel identified. Electronically Signed   By: Kristine Garbe M.D.   On: 07/03/2018 18:24    1845:  No stool output from ostomy since yesterday; CT as above. Pt refuses NGT. Pt does request her GI MD Pyrtle be involved in her care when she is admitted. Dx and testing d/w pt and family.  Questions answered.  Verb understanding, agreeable to admit.  T/C  returned from [facility] Dr. Johney Maine, case discussed, including:  HPI, pertinent PM/SHx, VS/PE, dx testing, ED course and treatment:  Agreeable to consult.   1940:  T/C returned from [facility] Dr. Alcario Drought, case discussed, including:  HPI, pertinent PM/SHx, VS/PE, dx testing, ED course and treatment:  Agreeable to admit.       Final Clinical Impressions(s) / ED Diagnoses   Final diagnoses:  None    ED Discharge Orders    None       Francine Graven, DO 07/05/18 2202

## 2018-07-03 NOTE — H&P (Signed)
History and Physical    Lauren Figueroa SWF:[id number] DOB: [birth year] DOA: 07/03/2018  PCP: Patient, No Pcp Per  Patient coming from: Home  I have personally briefly reviewed patient's old medical records in [place]  Chief Complaint: N/V, Abd pain  HPI: Lauren DEHNE is a 68 y.o. female with medical history significant of crohn's disease s/p poroctocolectomy with ileostomy.  Patient presents to ED with c/o gradual onset and persistent generalized abd pain since yesterday.  Multiple episodes of N/V as well as no stool output from ostomy up until that time.  No fevers, no dysuria.   ED Course: Found to have SBO.  Gen surg consulted and hospitalist asked to admit.   Review of Systems: As per HPI otherwise 10 point review of systems negative.   Past Medical History:  Diagnosis Date  . Anal stricture s/p APR resection   . Crohn's colitis, with fistula, LAR/bladder epair/ileostomy 2017 at [facility] 07/03/2018  . Crohn's disease with fistula ([place])   . 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 Fistula of intestine, excluding rectum and anus   . INTRAABDOMINAL ABSCESS 08/11/2008   Qualifier: Diagnosis of  By: Sharlett Iles MD Byrd Hesselbach   . Osteoporosis, unspecified   . 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 Unspecified hemorrhoids without mention of complication   . Unspecified intestinal obstruction     Past Surgical History:  Procedure Laterality Date  . ABDOMINOPERINEAL PROCTOCOLECTOMY  03/21/2016   Dr Sheryn Bison, [facility].  Ms. Dearcos had a complex phlegmon around the sigmoid colon which was fistulized to the SB and bladder. This complex could not be managed laparoscopically. The entire colon and rectum were diseased and abnormal with changes of chronic IBD. The colon was particularly friable on the left,  proximal to the obstructing stricture and fistula. The bladder is scarred to the left and an  . APPENDECTOMY    . BLADDER REPAIR  [phone]  . PERMANENT ILEOSTOMY  03/21/2016   Dr Sheryn Bison, [facility]  . SMALL INTESTINE SURGERY  03/21/2016  . TOTAL COLECTOMY  03/21/2016  . TUBAL LIGATION    . VAGINAL SEPTUM RESECTION  03/21/2016   Posterior vaginectomy for RV fistula     reports that she has quit smoking. She has never used smokeless tobacco. She reports current alcohol use. She reports that she does not use drugs.  Allergies  Allergen Reactions  . Cyclobenzaprine     Other reaction(s): Dizziness, hallucinations  . Nsaids     Crohn's disease / IBD  . Ciprofloxacin     REACTION: unspecified  . Codeine Nausea And Vomiting  . Metronidazole Nausea And Vomiting  . Penicillins Other (See Comments)    Crawling out of my skin  . Sulfa Antibiotics Nausea And Vomiting    Family History  Problem Relation Age of Onset  . Diabetes Maternal Grandfather   . Heart disease Father   . Lung cancer Father   . Heart disease Mother   . Diabetes Maternal Grandmother   . Diabetes Paternal Grandmother   . Diabetes Paternal Grandfather   . Crohn's disease Daughter   . Colon cancer Neg Hx      Prior to Admission medications   Medication Sig Start Date End Date Taking? Authorizing Provider  cholecalciferol (VITAMIN D) 1000 units tablet Take 2,000 Units by mouth  daily.    Yes [provider]  Multiple Vitamin (MULTIVITAMIN) tablet Take 1 tablet by mouth daily.     Yes [provider]    Physical Exam: Vitals:   07/03/18 1525 07/03/18 1700  BP: (!) 141/76 138/83  Pulse: 78 66  Resp: 16 16  Temp: 98.5 F (36.9 C)   TempSrc: Oral   SpO2: 100% 95%    Constitutional: NAD, calm, comfortable Eyes: PERRL, lids and conjunctivae normal ENMT: Mucous membranes are moist. Posterior pharynx clear of any exudate or lesions.Normal dentition.  Neck: normal, supple, no masses, no  thyromegaly Respiratory: clear to auscultation bilaterally, no wheezing, no crackles. Normal respiratory effort. No accessory muscle use.  Cardiovascular: Regular rate and rhythm, no murmurs / rubs / gallops. No extremity edema. 2+ pedal pulses. No carotid bruits.  Abdomen: TTP, distended, no rebound no guarding Musculoskeletal: no clubbing / cyanosis. No joint deformity upper and lower extremities. Good ROM, no contractures. Normal muscle tone.  Skin: no rashes, lesions, ulcers. No induration Neurologic: CN 2-12 grossly intact. Sensation intact, DTR normal. Strength 5/5 in all 4.  Psychiatric: Normal judgment and insight. Alert and oriented x 3. Normal mood.    Labs on Admission: I have personally reviewed following labs and imaging studies  CBC: Recent Labs  Lab 07/03/18 1537  WBC 8.3  HGB 12.6  HCT 38.4  MCV 91.2  PLT 237   Basic Metabolic Panel: Recent Labs  Lab 07/03/18 1537  NA 137  K 3.6  CL 101  CO2 26  GLUCOSE 125*  BUN 10  CREATININE 0.71  CALCIUM 9.3   GFR: CrCl cannot be calculated (Unknown ideal weight.). Liver Function Tests: Recent Labs  Lab 07/03/18 1537  AST 27  ALT 24  ALKPHOS 109  BILITOT 0.9  PROT 8.0  ALBUMIN 4.6   Recent Labs  Lab 07/03/18 1537  LIPASE 38   No results for input(s): AMMONIA in the last 168 hours. Coagulation Profile: No results for input(s): INR, PROTIME in the last 168 hours. Cardiac Enzymes: No results for input(s): CKTOTAL, CKMB, CKMBINDEX, TROPONINI in the last 168 hours. BNP (last 3 results) No results for input(s): PROBNP in the last 8760 hours. HbA1C: No results for input(s): HGBA1C in the last 72 hours. CBG: No results for input(s): GLUCAP in the last 168 hours. Lipid Profile: No results for input(s): CHOL, HDL, LDLCALC, TRIG, CHOLHDL, LDLDIRECT in the last 72 hours. Thyroid Function Tests: No results for input(s): TSH, T4TOTAL, FREET4, T3FREE, THYROIDAB in the last 72 hours. Anemia Panel: No results  for input(s): VITAMINB12, FOLATE, FERRITIN, TIBC, IRON, RETICCTPCT in the last 72 hours. Urine analysis:    Component Value Date/Time   COLORURINE LT. YELLOW 11/23/2008 1217   APPEARANCEUR CLEAR 11/23/2008 1217   LABSPEC <=1.005 11/23/2008 1217   PHURINE 6.5 11/23/2008 1217   GLUCOSEU NEGATIVE 11/23/2008 1217   HGBUR NEGATIVE 02/27/2007 1813   BILIRUBINUR NEGATIVE 11/23/2008 1217   KETONESUR NEGATIVE 11/23/2008 1217   PROTEINUR NEGATIVE 02/27/2007 1813   UROBILINOGEN 0.2 11/23/2008 1217   NITRITE NEGATIVE 11/23/2008 1217   LEUKOCYTESUR TRACE 11/23/2008 1217    Radiological Exams on Admission: Dg Chest 2 View  Result Date: 07/03/2018 CLINICAL DATA:  Abdominal pain, nausea and vomiting. Status post left hemicolectomy with colostomy. Decreased activity of the ostomy. EXAM: CHEST - 2 VIEW COMPARISON:  Two-view chest x-ray 07/21/2009 FINDINGS: Heart size is normal. Lungs are clear. The visualized soft tissues and bony thorax are unremarkable. IMPRESSION: Negative two view chest x-ray  Electronically Signed   By: San Morelle M.D.   On: 07/03/2018 17:49   Ct Abdomen Pelvis W Contrast  Result Date: 07/03/2018 CLINICAL DATA:  68 y/o F; history of Crohn's disease with ostomy placement. No output of the ostomy for 2 days. EXAM: CT ABDOMEN AND PELVIS WITH CONTRAST TECHNIQUE: Multidetector CT imaging of the abdomen and pelvis was performed using the standard protocol following bolus administration of intravenous contrast. CONTRAST:  100 cc Isovue-300 COMPARISON:  07/08/2009 CT abdomen and pelvis. 11/22/2015 MRI of the abdomen. FINDINGS: Lower chest: No acute abnormality. Hepatobiliary: Stable subcentimeter cyst within the dome of the liver. Otherwise no focal liver abnormality is seen. No gallstones, gallbladder wall thickening, or biliary dilatation. Pancreas: Unremarkable. No pancreatic ductal dilatation or surrounding inflammatory changes. Spleen: Normal in size without focal abnormality.  Adrenals/Urinary Tract: Adrenal glands are unremarkable. Kidneys are normal, without renal calculi, focal lesion, or hydronephrosis. Bladder is unremarkable. Stomach/Bowel: Right lower quadrant ileostomy. 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 There is mild diffuse distention of small bowel. No bowel wall thickening. Normal appearance of the stomach. Small bowel anastomosis noted in the mid abdomen. Colectomy. Unremarkable appearance of rectal pouch. Vascular/Lymphatic: Aortic atherosclerosis. No enlarged abdominal or pelvic lymph nodes. Reproductive: Status post hysterectomy. No adnexal masses. Other: No abdominal wall hernia or abnormality. No abdominopelvic ascites. Musculoskeletal: No fracture is seen. Mild dextrocurvature of the lumbar spine. Stable L5-S1 grade 1 anterolisthesis with advanced facet arthropathy. IMPRESSION: Right lower quadrant ileostomy. There is fecalization of small bowel contents at the ostomy and mild upstream dilatation of small bowel which may reflect stricture or partial obstruction. No inflammatory changes of the bowel identified. Electronically Signed   By: Kristine Garbe M.D.   On: 07/03/2018 18:24    EKG: Independently reviewed.  Assessment/Plan Principal Problem:   SBO (small bowel obstruction) at parastomal hernia Active Problems:   Crohn's disease ([place])   Crohn's colitis, with fistula, APR/SB resction/bladder repair/ileostomy 2017 at 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 SBO - 1. Gen surg has seen patient 1. Thinks it might have been parastomal hernia which he was able to reduce at bedside. 2. NPO 3. NGT has been  recommended and advised 4. zofran 5. IVF: NS at 100 cc/hr 6. Repeat CBC/BMP in AM 2. Crohns disease - 1. No flare in several years 2. No evidence of active inflammation on CT 3. Not on any chronic meds at this point.  DVT prophylaxis: Lovenox Code Status: Full Family Communication: Husband at bedside Disposition Plan: Home after admit Consults called: Dr. Johney [place] Admission status: Admit to inpatient  Severity of Illness: The appropriate patient status for this patient is INPATIENT. Inpatient status is judged to be reasonable and necessary in order to provide the required intensity of service to ensure the patient's safety. The patient's presenting symptoms, physical exam findings, and initial radiographic and laboratory data in the context of their chronic comorbidities is felt to place them at high risk for further clinical deterioration. Furthermore, it is not anticipated that the patient will be medically stable for discharge from the hospital within 2 midnights of admission. The following factors  support the patient status of inpatient.   IP status for treatment of SBO, patient NPO, needs NGT (though refusing at this time).   * I certify that at the point of admission it is my clinical judgment that the patient will require inpatient hospital care spanning beyond 2 midnights from the point of admission due to high intensity of service, high risk for further deterioration and high frequency of surveillance required.*    Tita Terhaar M. DO [facility]  How to contact the [facility] Attending or Consulting provider [place] or covering provider during after hours [place], for this patient?  1. Check the care team in [facility] and look for a) attending/consulting [facility] provider listed and b) the [facility] team listed 2. Log into [email or web address]  Amion Physician Scheduling and messaging for groups and whole hospitals  On call and physician scheduling software for group practices, residents,  hospitalists and other medical providers for call, clinic, rotation and shift schedules. OnCall Enterprise is a hospital-wide system for scheduling doctors and paging doctors on call. EasyPlot is for scientific plotting and data analysis.  [email or web address]  and use [place]'s universal password to access. If you do not have the password, please contact the hospital operator.  3. Locate the [facility] provider you are looking for under [facility] and page to a number that you can be directly reached. 4. If you still have difficulty reaching the provider, please page the [facility] (Director on Call) for the Hospitalists listed on amion for assistance.  07/03/2018, 8:49 PM

## 2018-07-03 NOTE — ED Notes (Signed)
ED TO INPATIENT HANDOFF REPORT  Name/Age/Gender Lauren Figueroa 68 y.o. female  Code Status    Code Status Orders  (From admission, onward)         Start     Ordered   07/03/18 2046  Full code  Continuous     07/03/18 2048        Code Status History    This patient has a current code status but no historical code status.    Advance Directive Documentation     Most Recent Value  Type of Advance Directive  Healthcare Power of Attorney, Living will  Pre-existing out of facility DNR order (yellow form or pink MOST form)  -  "MOST" Form in Place?  -      Home/SNF/Other Home  Chief Complaint stomach pain (sent by doctor)  Level of Care/Admitting Diagnosis ED Disposition    ED Disposition Condition [place]: [place] [100102]  Level of Care: Med-Surg [16]  Diagnosis: SBO (small bowel obstruction) [facility]) [388828]  Admitting Physician: Lauren Figueroa  Attending Physician: Lauren Figueroa [phone]  Estimated length of stay: past midnight tomorrow  Certification:: I certify this patient will need inpatient services for at least 2 midnights  PT Class (Do Not Modify): Inpatient [101]  PT Acc Code (Do Not Modify): Private [1]       Medical History Past Medical History:  Diagnosis Date  . Anal stricture s/p APR resection   . Crohn's colitis, with fistula, LAR/bladder epair/ileostomy 2017 at [facility] 07/03/2018  . Crohn's disease with fistula ([place])   . Enterocolic fistula 9179   Crohns disease  . 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 Fistula of intestine, excluding rectum and anus   . INTRAABDOMINAL ABSCESS 08/11/2008   Qualifier: Diagnosis of  By: Sharlett Iles MD Byrd Hesselbach   . Osteoporosis, unspecified   . 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 Stricture of sigmoid colon from Crohns s/p LAR resection/ileostomy 2017 at [facility] 07/03/2018  . Unspecified hemorrhoids without mention of  complication   . Unspecified intestinal obstruction     Allergies Allergies  Allergen Reactions  . Cyclobenzaprine     Other reaction(s): Dizziness, hallucinations  . Nsaids     Crohn's disease / IBD  . Ciprofloxacin     REACTION: unspecified  . Codeine Nausea And Vomiting  . Metronidazole Nausea And Vomiting  . Penicillins Other (See Comments)    Crawling out of my skin  . Sulfa Antibiotics Nausea And Vomiting    IV Location/Drains/Wounds Patient Lines/Drains/Airways Status   Active Line/Drains/Airways    Name:   Placement date:   Placement time:   Site:   Days:   Peripheral IV 07/03/18 Left Antecubital   07/03/18    1654    Antecubital   less than 1          Labs/Imaging Results for orders placed or performed during the hospital encounter of 07/03/18 (from the past 48 hour(s))  Lipase, blood     Status: None   Collection Time: 07/03/18  3:37 PM  Result Value Ref Range   Lipase 38 11 - 51 U/L    Comment: Performed at [facility], [place] [address], [place], [place] [address]  Comprehensive metabolic panel     Status: Abnormal   Collection Time: 07/03/18  3:37 PM  Result Value Ref Range   Sodium 137 135 - 145 mmol/L  Potassium 3.6 3.5 - 5.1 mmol/L   Chloride 101 98 - 111 mmol/L   CO2 26 22 - 32 mmol/L   Glucose, Bld 125 (H) 70 - 99 mg/dL   BUN 10 8 - 23 mg/dL   Creatinine, Ser 0.71 0.44 - 1.00 mg/dL   Calcium 9.3 8.9 - 10.3 mg/dL   Total Protein 8.0 6.5 - 8.1 g/dL   Albumin 4.6 3.5 - 5.0 g/dL   AST 27 15 - 41 U/L   ALT 24 0 - 44 U/L   Alkaline Phosphatase 109 38 - 126 U/L   Total Bilirubin 0.9 0.3 - 1.2 mg/dL   GFR calc non Af Amer >60 >60 mL/min   GFR calc Af Amer >60 >60 mL/min   Anion gap 10 5 - 15    Comment: Performed at [facility], [place] [address] [place], [place] [address]  CBC     Status: None   Collection Time: 07/03/18  3:37 PM  Result Value Ref Range   WBC 8.3 4.0 - 10.5 K/uL   RBC 4.21 3.87 - 5.11 MIL/uL    Hemoglobin 12.6 12.0 - 15.0 g/dL   HCT 38.4 36.0 - 46.0 %   MCV 91.2 80.0 - 100.0 fL   MCH 29.9 26.0 - 34.0 pg   MCHC 32.8 30.0 - 36.0 g/dL   RDW 13.3 11.5 - 15.5 %   Platelets 294 150 - 400 K/uL   nRBC 0.0 0.0 - 0.2 %    Comment: Performed at [facility], [place] [address], [place], [place] [address]   Dg Chest 2 View  Result Date: 07/03/2018 CLINICAL DATA:  Abdominal pain, nausea and vomiting. Status post left hemicolectomy with colostomy. Decreased activity of the ostomy. EXAM: CHEST - 2 VIEW COMPARISON:  Two-view chest x-ray 07/21/2009 FINDINGS: Heart size is normal. Lungs are clear. The visualized soft tissues and bony thorax are unremarkable. IMPRESSION: Negative two view chest x-ray Electronically Signed   By: San Morelle M.D.   On: 07/03/2018 17:49   Ct Abdomen Pelvis W Contrast  Result Date: 07/03/2018 CLINICAL DATA:  68 y/o F; history of Crohn's disease with ostomy placement. No output of the ostomy for 2 days. EXAM: CT ABDOMEN AND PELVIS WITH CONTRAST TECHNIQUE: Multidetector CT imaging of the abdomen and pelvis was performed using the standard protocol following bolus administration of intravenous contrast. CONTRAST:  100 cc Isovue-300 COMPARISON:  07/08/2009 CT abdomen and pelvis. 11/22/2015 MRI of the abdomen. FINDINGS: Lower chest: No acute abnormality. Hepatobiliary: Stable subcentimeter cyst within the dome of the liver. Otherwise no focal liver abnormality is seen. No gallstones, gallbladder wall thickening, or biliary dilatation. Pancreas: Unremarkable. No pancreatic ductal dilatation or surrounding inflammatory changes. Spleen: Normal in size without focal abnormality. Adrenals/Urinary Tract: Adrenal glands are unremarkable. Kidneys are normal, without renal calculi, focal lesion, or hydronephrosis. Bladder is unremarkable. Stomach/Bowel: Right lower quadrant ileostomy. There is a parastomal hernia containing loops of small bowel and there is  fecalization of small bowel contents just upstream to the ostomy (series 2, image 60). There is mild diffuse distention of small bowel. No bowel wall thickening. Normal appearance of the stomach. Small bowel anastomosis noted in the mid abdomen. Colectomy. Unremarkable appearance of rectal pouch. Vascular/Lymphatic: Aortic atherosclerosis. No enlarged abdominal or pelvic lymph nodes. Reproductive: Status post hysterectomy. No adnexal masses. Other: No abdominal wall hernia or abnormality. No abdominopelvic ascites. Musculoskeletal: No fracture is seen. Mild dextrocurvature of the lumbar spine. Stable L5-S1 grade 1 anterolisthesis with  advanced facet arthropathy. IMPRESSION: Right lower quadrant ileostomy. There is fecalization of small bowel contents at the ostomy and mild upstream dilatation of small bowel which may reflect stricture or partial obstruction. No inflammatory changes of the bowel identified. Electronically Signed   By: Kristine Garbe M.D.   On: 07/03/2018 18:24   None  Pending Labs Unresulted Labs (From admission, onward)    Start     Ordered   07/04/18 0500  CBC  Tomorrow morning,   R     07/03/18 2048   07/04/18 2536  Basic metabolic panel  Tomorrow morning,   R     07/03/18 2048   07/03/18 2044  HIV antibody (Routine Testing)  Once,   R     07/03/18 2048   07/03/18 1526  Urinalysis, Routine w reflex microscopic  ONCE - STAT,   STAT     07/03/18 1525          Vitals/Pain Today's Vitals   07/03/18 1524 07/03/18 1525 07/03/18 1700  BP:  (!) 141/76 138/83  Pulse:  78 66  Resp:  16 16  Temp:  98.5 F (36.9 C)   TempSrc:  Oral   SpO2:  100% 95%  PainSc: 9       Isolation Precautions No active isolations  Medications Medications  0.9 %  sodium chloride infusion ( Intravenous New Bag/Given 07/03/18 1659)  morphine 4 MG/ML injection 4 mg (has no administration in time range)  sodium chloride (PF) 0.9 % injection (has no administration in time range)   diatrizoate meglumine-sodium (GASTROGRAFIN) 66-10 % solution 90 mL (has no administration in time range)  lactated ringers bolus 1,000 mL (has no administration in time range)  methocarbamol (ROBAXIN) 1,000 mg in dextrose 5 % 50 mL IVPB (has no administration in time range)  prochlorperazine (COMPAZINE) injection 5-10 mg (has no administration in time range)  lip balm (CARMEX) ointment 1 application (has no administration in time range)  magic mouthwash (has no administration in time range)  acetaminophen (TYLENOL) tablet 650 mg (has no administration in time range)    Or  acetaminophen (TYLENOL) suppository 650 mg (has no administration in time range)  ondansetron (ZOFRAN) tablet 4 mg (has no administration in time range)    Or  ondansetron (ZOFRAN) injection 4 mg (has no administration in time range)  enoxaparin (LOVENOX) injection 40 mg (has no administration in time range)  ketorolac (TORADOL) 15 MG/ML injection 15 mg (15 mg Intravenous Given 07/03/18 1716)  iopamidol (ISOVUE-300) 61 % injection 100 mL (100 mLs Intravenous Contrast Given 07/03/18 1752)  sodium chloride 0.9 % bolus 1,000 mL (1,000 mLs Intravenous New Bag/Given 07/03/18 1938)  lactated ringers bolus 1,000 mL (1,000 mLs Intravenous New Bag/Given 07/03/18 1958)    Mobility walks

## 2018-07-03 NOTE — Telephone Encounter (Signed)
I am sorry that she is having difficulties. It sounds like she needs to go to an Urgent Care or ED for further evaluation (Ideally someplace with access to x-rays).  Thank you.

## 2018-07-03 NOTE — Consult Note (Signed)
Lauren Figueroa  1950/06/07 [id number]  CARE TEAM:  PCP: Patient, No Pcp Per  Outpatient Care Team: Patient Care Team: Patient, No Pcp Per as PCP - General ([place]) Kem Parkinson, MD as Consulting Physician (Colon and Rectal Surgery) Pyrtle, Lajuan Lines, MD as Consulting Physician (Gastroenterology) Voncille Lo Placido Sou, MD as Consulting Physician (Urology)  Inpatient Treatment Team: Treatment Team: Attending Provider: Francine Graven, DO; Technician: Laurice Record, NT; Consulting Physician: Nolon Nations, MD; Consulting Physician: Jerene Bears, MD   This patient is a 68 y.o.female who presents today for surgical evaluation at the request of Dr Thurnell Garbe.   Chief complaint / Reason for evaluation: SBO  Pleasant woman with a history of Crohn's disease for several decades.  Followed by [facility] gastroenterology for most of this time.  Initially Dr. Sharlett Iles, and then more recently Dr. Hilarie Fredrickson.  Intermittently on Humira.  Had seen Dr. Annabell Sabal many decades ago with our group who has since retired and passed away.  Had seen Dr. Lucia Gaskins with our group intermittently in the past apparently.  She had some mild abdominal pain and crampiness.  Found on an MRI enterography in 2017 to have phlegmons with probable fistula to bladder and complicated disease.  Sent to [facility].  Dr. Sheryn Bison evaluated and recommended surgical intervention.   Lap converted to open resection.  Sigmoid and anal strictures.  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  Fistula to small intestine.  Small intestine to the bladder.  Small intestine to colon.  Extremely complicated with poor tissue quality and inflammation.  Transverse and splenic flexure colon adherent to down there as well.  Nonsalvageable.  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  Small bowel resections, partial vaginal wall resection for rectovaginal fistula, bladder repair.  November  2017.  Gradually recovered.  As far as I can gather she has not had any major issues for the past few years.  She notes now that she is been having the ileostomy and diverted, she is not struggled with the lower pelvic pain and fistulas and drainage.  Last saw [facility] last fall.    However she started getting some crampy abdominal pain and decreased daily ostomy output starting yesterday morning.  Persisted concerned her.  No improvement with Dulcolax oral suppositories.  She called the gastroenterology office.  They recommended evaluation.  Emergency room.  Not particularly toxic.  CAT scan concerning for bowel obstruction.  Surgical consultation requested.  Patient denies any sick contacts or travel history.  The ileostomy has been working rather well with her.  No history of flares or needing any immunosuppression in the past few years.  Output can vary depending on diet, but usually she empties the bag 3-4 times a day.  Not needing any antidiarrheals.  No history of polyps or cancer that she is aware of.  No sores or lesions.  She has some mild vaginal drainage from time to time but nothing severe nor high-volume.  No pneumaturia or recurrent urinary tract infections.  Her perineal wound from her proctectomy has totally closed up with no draining sinus.     Assessment  Lauren Figueroa  68 y.o. 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  Plan:  Patient had obvious bulging around her  ileostomy that correlated with the small bowel up in the parastomal hernia.  I was able to reduce it down gradually.  She felt some relief.  Medical admission.  Gastroenterology consultation.  Doubt there is any active ileitis or other flare, but would be helpful to see if they have further input.  Rule out possible ileal strictures as an etiology if obstruction persists after parastomal hernia reduction.  Nasogastric tube and small bowel protocol.  She refuses NG tube.  Perhaps she could at least drink the Gastrografin.  She may require surgical intervention this admission if she does not open up or feels worse.  May require operative reduction of the parastomal hernia and some type of temporizing primary repair around the stoma.  Most likely she will require some type of parastomal hernia repair.  Ideally would be minimally invasive with some type of Sugarbaker type underlay mesh repair.  Most likely combination of absorbable mesh at the ileum and something more resilient elsewhere.  Try and hold off any emergent intervention if possible.  Do not know if she want to stay here versus go back to [facility].  Discussion with patient and her husband.  They are appreciative of her care.  There had been discussion about her going back to [facility] since they had done her last (and largest) surgery.  They had decided to come through [facility] health emergency department at [facility] long.  They felt comfortable staying in town here with [place].  -VTE prophylaxis- SCDs, etc -mobilize as tolerated to help recovery  60 minutes spent in review, evaluation, examination, counseling, and coordination of care.  More than 50% of that time was spent in counseling.  Adin Hector, MD, FACS, MASCRS Gastrointestinal and Minimally Invasive Surgery    [address] [place], [place] [address] [phone] Main / Paging [phone] Fax   07/03/2018      Past Medical History:  Diagnosis Date    . Anal stricture s/p APR resection   . Crohn's colitis, with fistula, LAR/bladder epair/ileostomy 2017 at [facility] 07/03/2018  . Crohn's disease with fistula ([place])   . 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 Fistula of intestine, excluding rectum and anus   . INTRAABDOMINAL ABSCESS 08/11/2008   Qualifier: Diagnosis of  By: Sharlett Iles MD Byrd Hesselbach   . Osteoporosis, unspecified   . 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 Stricture of sigmoid colon from Crohns s/p LAR resection/ileostomy 2017 at [facility] 07/03/2018  . Unspecified hemorrhoids without mention of complication   . Unspecified intestinal obstruction     Past Surgical History:  Procedure Laterality Date  . ABDOMINOPERINEAL PROCTOCOLECTOMY  03/21/2016   Dr Sheryn Bison, [facility].  Ms. Mihok had a complex phlegmon around the sigmoid colon which was fistulized to the SB and bladder. This complex could not be managed laparoscopically. The entire colon and rectum were diseased and abnormal with changes of chronic IBD. The colon was particularly friable on the left, proximal to the obstructing stricture and fistula. The bladder is scarred to the left and an  . APPENDECTOMY    . BLADDER  REPAIR  [id number]  . PERMANENT ILEOSTOMY  03/21/2016   Dr Sheryn Bison, [facility]  . SMALL INTESTINE SURGERY  03/21/2016  . TOTAL COLECTOMY  03/21/2016  . TUBAL LIGATION    . VAGINAL SEPTUM RESECTION  03/21/2016   Posterior vaginectomy for RV fistula    Social History   Socioeconomic History  . Marital status: Married    Spouse name: Not on file  . Number of children: 2  . Years of education: Not on file  . Highest education level: Not on file  Occupational History  . 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 Financial resource strain: Not on file  . Food insecurity:    Worry: Not on file    Inability: Not on file  . Transportation needs:    Medical: Not on file     Non-medical: Not on file  Tobacco Use  . Smoking status: Former Research scientist (life sciences)  . Smokeless tobacco: Never Used  Substance and Sexual Activity  . Alcohol use: Yes    Comment: occasional wine  . Drug use: No  . Sexual activity: Not on file  Lifestyle  . Physical activity:    Days per week: Not on file    Minutes per session: Not on file  . Stress: Not on file  Relationships  . Social connections:    Talks on phone: Not on file    Gets together: Not on file    Attends religious service: Not on file    Active member of club or organization: Not on file    Attends meetings of clubs or organizations: Not on file    Relationship status: Not on file  . Intimate partner violence:    Fear of current or ex partner: Not on file    Emotionally abused: Not on file    Physically abused: Not on file    Forced sexual activity: Not on file  Other Topics Concern  . Not on file  Social History Narrative  . Not on file    Family History  Problem Relation Age of Onset  . Diabetes Maternal Grandfather   . Heart disease Father   . Lung cancer Father   . Heart disease Mother   . Diabetes Maternal Grandmother   . Diabetes Paternal Grandmother   . Diabetes Paternal Grandfather   . Crohn's disease Daughter   . Colon cancer Neg Hx     Current Facility-Administered Medications  Medication Dose Route Frequency Provider Last Rate Last Dose  . 0.9 %  sodium chloride infusion   Intravenous Continuous Francine Graven, DO 100 mL/hr at 07/03/18 1659    . morphine 4 MG/ML injection 4 mg  4 mg Intravenous Q30 min PRN Francine Graven, DO      . ondansetron [facility]) injection 4 mg  4 mg Intravenous Q1H PRN Francine Graven, DO      . sodium chloride (PF) 0.9 % injection           . sodium chloride 0.9 % bolus 1,000 mL  1,000 mL Intravenous Once Francine Graven, DO       Current Outpatient Medications  Medication Sig Dispense Refill  . cholecalciferol (VITAMIN D) 1000 units tablet Take 2,000 Units by  mouth daily.     . Multiple Vitamin (MULTIVITAMIN) tablet Take 1 tablet by mouth daily.         Allergies  Allergen Reactions  . Cyclobenzaprine     Other reaction(s): Dizziness, hallucinations  . Nsaids  Crohn's disease / IBD  . Ciprofloxacin     REACTION: unspecified  . Codeine Nausea And Vomiting  . Metronidazole Nausea And Vomiting  . Penicillins Other (See Comments)    Crawling out of my skin  . Sulfa Antibiotics Nausea And Vomiting    ROS:   All other systems reviewed & are negative except per HPI or as noted below: Constitutional:  No fevers, chills, sweats.  Weight stable Eyes:  No vision changes, No discharge HENT:  No sore throats, nasal drainage Lymph: No neck swelling, No bruising easily Pulmonary:  No cough, productive sputum CV: No orthopnea, PND  Patient walks 30 minutes for about 1 miles without difficulty.  No exertional chest/neck/shoulder/arm pain. GI:  No personal nor family history of GI/colon cancer,irritable bowel syndrome, allergy such as Celiac Sprue, dietary/dairy problems, colitis, ulcers nor gastritis.  No recent sick contacts/gastroenteritis.  No travel outside the country.  No changes in diet.  Crohn's disease as noted above for many decades Renal: No UTIs, No hematuria Genital:  No drainage, bleeding, masses Musculoskeletal: No severe joint pain.  Good ROM major joints Skin:  No sores or lesions.  No rashes Heme/Lymph:  No easy bleeding.  No swollen lymph nodes Neuro: No focal weakness/numbness.  No seizures Psych: No suicidal ideation.  No hallucinations  BP 138/83   Pulse 66   Temp 98.5 F (36.9 C) (Oral)   Resp 16   SpO2 95%   Physical Exam: General: Pt awake/alert/oriented x4 in no major acute distress Eyes: PERRL, normal EOM. Sclera nonicteric Neuro: CN II-XII intact w/o focal sensory/motor deficits. Lymph: No head/neck/groin lymphadenopathy Psych:  No delerium/psychosis/paranoia HENT: Normocephalic, Mucus membranes moist.  No  thrush Neck: Supple, No tracheal deviation Chest: No pain.  Good respiratory excursion. CV:  Pulses intact.  Regular rhythm  Abdomen: Mildly overweight but soft.  Obvious bulging around right lower quadrant ileostomy.  Gradually able to reduce down consistent with reducible parastomal hernia.  Lower abdomen much more symmetrical without any definite evidence of recurrent bulging.  No cellulitis maceration or gangrene of the skin or soft tissues.  Discomfort with reduction but relief after reduced.  Midline incision closed without obvious hernias.  Moderately distended.  No guarding or peritonitis.    Gen:  No inguinal hernias.  No inguinal lymphadenopathy.  Mild sensitivity in introitus of vagina but no major drainage. Rectal.  No anus.  APR perineal incision closed without any sinuses or phlegmon or abscess. Ext:  SCDs BLE.  No significant edema.  No cyanosis Skin: No petechiae / purpurea.  No major sores Musculoskeletal: No severe joint pain.  Good ROM major joints   Results:   Labs: Results for orders placed or performed during the hospital encounter of 07/03/18 (from the past 48 hour(s))  Lipase, blood     Status: None   Collection Time: 07/03/18  3:37 PM  Result Value Ref Range   Lipase 38 11 - 51 U/L    Comment: Performed at [facility], [place] [address], [place], [place] [address]  Comprehensive metabolic panel     Status: Abnormal   Collection Time: 07/03/18  3:37 PM  Result Value Ref Range   Sodium 137 135 - 145 mmol/L   Potassium 3.6 3.5 - 5.1 mmol/L   Chloride 101 98 - 111 mmol/L   CO2 26 22 - 32 mmol/L   Glucose, Bld 125 (H) 70 - 99 mg/dL   BUN 10 8 - 23 mg/dL   Creatinine, Ser 0.71  0.44 - 1.00 mg/dL   Calcium 9.3 8.9 - 10.3 mg/dL   Total Protein 8.0 6.5 - 8.1 g/dL   Albumin 4.6 3.5 - 5.0 g/dL   AST 27 15 - 41 U/L   ALT 24 0 - 44 U/L   Alkaline Phosphatase 109 38 - 126 U/L   Total Bilirubin 0.9 0.3 - 1.2 mg/dL   GFR calc non Af Amer >60 >60 mL/min    GFR calc Af Amer >60 >60 mL/min   Anion gap 10 5 - 15    Comment: Performed at [facility], [place] [address] [place], [place] [address]  CBC     Status: None   Collection Time: 07/03/18  3:37 PM  Result Value Ref Range   WBC 8.3 4.0 - 10.5 K/uL   RBC 4.21 3.87 - 5.11 MIL/uL   Hemoglobin 12.6 12.0 - 15.0 g/dL   HCT 38.4 36.0 - 46.0 %   MCV 91.2 80.0 - 100.0 fL   MCH 29.9 26.0 - 34.0 pg   MCHC 32.8 30.0 - 36.0 g/dL   RDW 13.3 11.5 - 15.5 %   Platelets 294 150 - 400 K/uL   nRBC 0.0 0.0 - 0.2 %    Comment: Performed at [facility], [place] [address], [place], [place] [address]    Imaging / Studies: Dg Chest 2 View  Result Date: 07/03/2018 CLINICAL DATA:  Abdominal pain, nausea and vomiting. Status post left hemicolectomy with colostomy. Decreased activity of the ostomy. EXAM: CHEST - 2 VIEW COMPARISON:  Two-view chest x-ray 07/21/2009 FINDINGS: Heart size is normal. Lungs are clear. The visualized soft tissues and bony thorax are unremarkable. IMPRESSION: Negative two view chest x-ray Electronically Signed   By: San Morelle M.D.   On: 07/03/2018 17:49   Ct Abdomen Pelvis W Contrast  Result Date: 07/03/2018 CLINICAL DATA:  68 y/o F; history of Crohn's disease with ostomy placement. No output of the ostomy for 2 days. EXAM: CT ABDOMEN AND PELVIS WITH CONTRAST TECHNIQUE: Multidetector CT imaging of the abdomen and pelvis was performed using the standard protocol following bolus administration of intravenous contrast. CONTRAST:  100 cc Isovue-300 COMPARISON:  07/08/2009 CT abdomen and pelvis. 11/22/2015 MRI of the abdomen. FINDINGS: Lower chest: No acute abnormality. Hepatobiliary: Stable subcentimeter cyst within the dome of the liver. Otherwise no focal liver abnormality is seen. No gallstones, gallbladder wall thickening, or biliary dilatation. Pancreas: Unremarkable. No pancreatic ductal dilatation or surrounding inflammatory changes. Spleen:  Normal in size without focal abnormality. Adrenals/Urinary Tract: Adrenal glands are unremarkable. Kidneys are normal, without renal calculi, focal lesion, or hydronephrosis. Bladder is unremarkable. Stomach/Bowel: Right lower quadrant ileostomy. 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 There is mild diffuse distention of small bowel. No bowel wall thickening. Normal appearance of the stomach. Small bowel anastomosis noted in the mid abdomen. Colectomy. Unremarkable appearance of rectal pouch. Vascular/Lymphatic: Aortic atherosclerosis. No enlarged abdominal or pelvic lymph nodes. Reproductive: Status post hysterectomy. No adnexal masses. Other: No abdominal wall hernia or abnormality. No abdominopelvic ascites. Musculoskeletal: No fracture is seen. Mild dextrocurvature of the lumbar spine. Stable L5-S1 grade 1 anterolisthesis with advanced facet arthropathy. IMPRESSION: Right lower quadrant ileostomy. There is fecalization of small bowel contents at the ostomy and mild upstream dilatation of small bowel which may reflect stricture or partial obstruction. No inflammatory changes of the bowel identified. Electronically Signed  By: Kristine Garbe M.D.   On: 07/03/2018 18:24    Medications / Allergies: per chart  Antibiotics: Anti-infectives (From admission, onward)   None        Note: Portions of this report may have been transcribed using voice recognition software. Every effort was made to ensure accuracy; however, inadvertent computerized transcription errors may be present.   Any transcriptional errors that result from this process are unintentional.    Adin Hector, MD, FACS, MASCRS Gastrointestinal and Minimally Invasive Surgery    [address], [place], [place] [address] [phone] Main / Paging [phone] Fax   07/03/2018

## 2018-07-04 ENCOUNTER — Inpatient Hospital Stay (HOSPITAL_COMMUNITY): Payer: BLUE CROSS/BLUE SHIELD

## 2018-07-04 LAB — CBC
HEMATOCRIT: 33.5 % — AB (ref 36.0–46.0)
Hemoglobin: 10.6 g/dL — ABNORMAL LOW (ref 12.0–15.0)
MCH: 29.8 pg (ref 26.0–34.0)
MCHC: 31.6 g/dL (ref 30.0–36.0)
MCV: 94.1 fL (ref 80.0–100.0)
Platelets: 215 10*3/uL (ref 150–400)
RBC: 3.56 MIL/uL — ABNORMAL LOW (ref 3.87–5.11)
RDW: 13.3 % (ref 11.5–15.5)
WBC: 4.5 10*3/uL (ref 4.0–10.5)
nRBC: 0 % (ref 0.0–0.2)

## 2018-07-04 LAB — URINALYSIS, ROUTINE W REFLEX MICROSCOPIC
BILIRUBIN URINE: NEGATIVE
Glucose, UA: NEGATIVE mg/dL
KETONES UR: NEGATIVE mg/dL
Nitrite: NEGATIVE
PH: 6 (ref 5.0–8.0)
Protein, ur: NEGATIVE mg/dL
Specific Gravity, Urine: 1.034 — ABNORMAL HIGH (ref 1.005–1.030)

## 2018-07-04 LAB — BASIC METABOLIC PANEL
Anion gap: 6 (ref 5–15)
BUN: 11 mg/dL (ref 8–23)
CO2: 23 mmol/L (ref 22–32)
Calcium: 8.2 mg/dL — ABNORMAL LOW (ref 8.9–10.3)
Chloride: 110 mmol/L (ref 98–111)
Creatinine, Ser: 0.61 mg/dL (ref 0.44–1.00)
GFR calc Af Amer: >60 mL/min (ref >60)
GFR calc non Af Amer: >60 mL/min (ref >60)
Glucose, Bld: 102 mg/dL — ABNORMAL HIGH (ref 70–99)
Potassium: 3.4 mmol/L — ABNORMAL LOW (ref 3.5–5.1)
Sodium: 139 mmol/L (ref 135–145)

## 2018-07-04 LAB — HIV ANTIBODY (ROUTINE TESTING W REFLEX): HIV Screen 4th Generation wRfx: NONREACTIVE

## 2018-07-04 LAB — C-REACTIVE PROTEIN: CRP: 3.6 mg/dL — ABNORMAL HIGH (ref <1.0)

## 2018-07-04 LAB — SEDIMENTATION RATE: Sed Rate: 24 mm/hr — ABNORMAL HIGH (ref 0–22)

## 2018-07-04 MED ORDER — PROMETHAZINE HCL 25 MG/ML IJ SOLN: 12.5000 mg | Freq: Four times a day (QID) | INTRAMUSCULAR | Status: DC | PRN

## 2018-07-04 MED ORDER — HYDROMORPHONE HCL 1 MG/ML IJ SOLN
0.5000 mg | INTRAMUSCULAR | Status: DC | PRN
  Administered 2018-07-04 (×4): 0.5 mg via INTRAVENOUS
  Filled 2018-07-04 (×4): qty 0.5

## 2018-07-04 NOTE — Consult Note (Signed)
[facility] Nurse ostomy consult note Stoma type/location: R Ileostomy Stomal assessment/size:  Not measured Peristomal assessment: pt states always intact Treatment options for stomal/peristomal skin: NA Output small amount light brown effluent in pouch Ostomy pouching: Pt has supplies from home Education provided: NA Enrolled patient in [place] program: Yes/No NO not applicable  Pt in house for SBO with parastomal hernia. Dr. Johney Maine able to reduce hernia at bedside. Pt would like to use her own supplies, M9 given at her request. No further needs at this time, pt to let bedside RN know if she needs our assistance again. WOC will follow while in house. Fara Olden, RN-C, WTA-C, [facility] Ostomy Care Associate

## 2018-07-04 NOTE — Progress Notes (Signed)
Pt refused NGT , refused gastrografin. C/o nausea, 1 episode of emesis. Radiology department was made aware. Medication for nausea given as ordered, effective. Continue to monitor.

## 2018-07-04 NOTE — Progress Notes (Signed)
[facility] Progress Note     Subjective: CC: abdominal pain Patient reports soreness around stoma and in L mid-abdomen. Feels better than when she came in. Does not feel like hernia has become incarcerated again. One episode bilious emesis overnight but currently denies nausea. Has had a very small amount stool from ileostomy but not much.   Objective: Vital signs in last 24 hours: Temp:  [97.8 F (36.6 C)-98.5 F (36.9 C)] 98.1 F (36.7 C) (02/20 0502) Pulse Rate:  [65-78] 65 (02/20 0502) Resp:  [16] 16 (02/20 0502) BP: (112-141)/(61-83) 112/61 (02/20 0502) SpO2:  [94 %-100 %] 94 % (02/20 0502)    Intake/Output from previous day: 02/19 0701 - 02/20 0700 In: 4780 [I.V.:2780; IV Piggyback:2000] Out: 200 [Urine:200] Intake/Output this shift: No intake/output data recorded.  PE: Gen:  Alert, NAD, pleasant Card:  Regular rate and rhythm Pulm:  Normal effort, clear to auscultation bilaterally Abd: Soft, mildly TTP around ileostomy and in L mid abdomine, mildly distended, bowel sounds hypoactive, stoma pink with minimal amount soft brown stool Skin: warm and dry, no rashes  Psych: A&Ox3   Lab Results:  Recent Labs    07/03/18 1537 07/04/18 0550  WBC 8.3 4.5  HGB 12.6 10.6*  HCT 38.4 33.5*  PLT 294 215   BMET Recent Labs    07/03/18 1537 07/04/18 0550  NA 137 139  K 3.6 3.4*  CL 101 110  CO2 26 23  GLUCOSE 125* 102*  BUN 10 11  CREATININE 0.71 0.61  CALCIUM 9.3 8.2*   PT/INR No results for input(s): LABPROT, INR in the last 72 hours. CMP     Component Value Date/Time   NA 139 07/04/2018 0550   K 3.4 (L) 07/04/2018 0550   CL 110 07/04/2018 0550   CO2 23 07/04/2018 0550   GLUCOSE 102 (H) 07/04/2018 0550   BUN 11 07/04/2018 0550   CREATININE 0.61 07/04/2018 0550   CALCIUM 8.2 (L) 07/04/2018 0550   PROT 8.0 07/03/2018 1537   ALBUMIN 4.6 07/03/2018 1537   AST 27 07/03/2018 1537   ALT 24 07/03/2018 1537   ALKPHOS 109 07/03/2018 1537    BILITOT 0.9 07/03/2018 1537   GFRNONAA >60 07/04/2018 0550   GFRAA >60 07/04/2018 0550   Lipase     Component Value Date/Time   LIPASE 38 07/03/2018 1537       Studies/Results: Dg Chest 2 View  Result Date: 07/03/2018 CLINICAL DATA:  Abdominal pain, nausea and vomiting. Status post left hemicolectomy with colostomy. Decreased activity of the ostomy. EXAM: CHEST - 2 VIEW COMPARISON:  Two-view chest x-ray 07/21/2009 FINDINGS: Heart size is normal. Lungs are clear. The visualized soft tissues and bony thorax are unremarkable. IMPRESSION: Negative two view chest x-ray Electronically Signed   By: San Morelle M.D.   On: 07/03/2018 17:49   Ct Abdomen Pelvis W Contrast  Result Date: 07/03/2018 CLINICAL DATA:  68 y/o F; history of Crohn's disease with ostomy placement. No output of the ostomy for 2 days. EXAM: CT ABDOMEN AND PELVIS WITH CONTRAST TECHNIQUE: Multidetector CT imaging of the abdomen and pelvis was performed using the standard protocol following bolus administration of intravenous contrast. CONTRAST:  100 cc Isovue-300 COMPARISON:  07/08/2009 CT abdomen and pelvis. 11/22/2015 MRI of the abdomen. FINDINGS: Lower chest: No acute abnormality. Hepatobiliary: Stable subcentimeter cyst within the dome of the liver. Otherwise no focal liver abnormality is seen. No gallstones, gallbladder wall thickening, or biliary dilatation. Pancreas: Unremarkable. No pancreatic ductal dilatation or  surrounding inflammatory changes. Spleen: Normal in size without focal abnormality. Adrenals/Urinary Tract: Adrenal glands are unremarkable. Kidneys are normal, without renal calculi, focal lesion, or hydronephrosis. Bladder is unremarkable. Stomach/Bowel: Right lower quadrant ileostomy. 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 There is mild diffuse distention of small bowel. No bowel wall  thickening. Normal appearance of the stomach. Small bowel anastomosis noted in the mid abdomen. Colectomy. Unremarkable appearance of rectal pouch. Vascular/Lymphatic: Aortic atherosclerosis. No enlarged abdominal or pelvic lymph nodes. Reproductive: Status post hysterectomy. No adnexal masses. Other: No abdominal wall hernia or abnormality. No abdominopelvic ascites. Musculoskeletal: No fracture is seen. Mild dextrocurvature of the lumbar spine. Stable L5-S1 grade 1 anterolisthesis with advanced facet arthropathy. IMPRESSION: Right lower quadrant ileostomy. There is fecalization of small bowel contents at the ostomy and mild upstream dilatation of small bowel which may reflect stricture or partial obstruction. No inflammatory changes of the bowel identified. Electronically Signed   By: Kristine Garbe M.D.   On: 07/03/2018 18:24    Anti-infectives: Anti-infectives (From admission, onward)   None       Assessment/Plan Crohn's diease - recommend GI consult for input  Parastomal hernia with SBO - patient refusing NGT at this time, had 1 episode bilious emesis overnight but no longer nauseated - if patient becoming more nauseated and having more emesis would recommend NGT placement but ok to leave out for now - hernia remains reduced - PO gastrografin today with 8h delay abdominal film - mobilize as able  FEN: NPO, IVF VTE: SCDs, lovenox ID: no abx indicated at this time  LOS: 1 day    Brigid Re , [facility] 07/04/2018, 8:17 AM Pager: [phone] Consults: [phone]

## 2018-07-04 NOTE — Progress Notes (Signed)
Pt given PO gastrografin. Will continue to monitor.

## 2018-07-04 NOTE — Plan of Care (Signed)
  Problem: Education: Goal: Knowledge of General Education information will improve Description: Including pain rating scale, medication(s)/side effects and non-pharmacologic comfort measures Outcome: Progressing   Problem: Coping: Goal: Level of anxiety will decrease Outcome: Progressing   Problem: Pain Managment: Goal: General experience of comfort will improve Outcome: Progressing   

## 2018-07-04 NOTE — Consult Note (Addendum)
Consultation  Referring Provider: Dr. Cyndia Skeeters     Primary Care Physician:  Patient, No Pcp Per Primary Gastroenterologist: Dr. Hilarie Fredrickson       Reason for Consultation: Abdominal small bowel obstruction, pain, history of fistulizing Crohn's disease             HPI:   Lauren Figueroa is a 68 y.o. 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    Today, the patient describes that she had gradual onset of persistent generalized abdominal pain 07/02/2018 with multiple episodes of nausea and vomiting and noticed that she had no stool output from her ostomy.  The last output she remembers was 2 PM in the afternoon on 07/02/2018.  Also noticed increasing abdominal distention and discomfort.  Proceeded to the ER.  Prior to all of this was having no abdominal pain and no problems.    Currently, patient has finished the Gastrografin contrast for planned small bowel follow-through later today and has actually started seeing a small amount of output from her ostomy.  Continues to be somewhat nauseous and did require Compazine after drinking Gastrografin but no vomiting.    Denies fever, chills, weight loss, heartburn, reflux or symptoms that awaken her from sleep.  ED course: Found to have SBO, general surgery consulted     GI history: 01/29/2018 office visit Dr. Hilarie Fredrickson: At that time was doing well after proctocolectomy with end ileostomy in 2017, it was discussed that she had never had small bowel Crohn's and at that point there was no evidence for Crohn's disease in her small bowel, it was hoped that she was cured after total proctocolectomy  Past Medical History:  Diagnosis Date  . Anal stricture s/p APR resection   . Crohn's colitis, with fistula, LAR/bladder epair/ileostomy 2017 at [facility] 07/03/2018  . Crohn's disease  with fistula ([place])   . 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 Fistula of intestine, excluding rectum and anus   . INTRAABDOMINAL ABSCESS 08/11/2008   Qualifier: Diagnosis of  By: Sharlett Iles MD Byrd Hesselbach   . Osteoporosis, unspecified   . 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 Unspecified hemorrhoids without mention of complication   . Unspecified intestinal obstruction     Past Surgical History:  Procedure Laterality Date  . ABDOMINOPERINEAL PROCTOCOLECTOMY  03/21/2016   Dr Sheryn Bison, [facility].  Lauren Figueroa had a complex phlegmon around the sigmoid colon which was fistulized to the SB and bladder. This complex could not be managed laparoscopically. The entire colon and rectum were diseased and abnormal with changes of chronic IBD. The colon was particularly friable on the left, proximal to the obstructing stricture and fistula. The bladder is scarred to the left and an  . APPENDECTOMY    . BLADDER REPAIR  [id number]  . PERMANENT ILEOSTOMY  03/21/2016   Dr Sheryn Bison, [facility]  . SMALL INTESTINE SURGERY  03/21/2016  . TOTAL COLECTOMY  03/21/2016  . TUBAL LIGATION    . VAGINAL SEPTUM RESECTION  03/21/2016   Posterior vaginectomy for RV fistula    Family History  Problem Relation Age of Onset  . Diabetes Maternal Grandfather   . Heart  disease Father   . Lung cancer Father   . Heart disease Mother   . Diabetes Maternal Grandmother   . Diabetes Paternal Grandmother   . Diabetes Paternal Grandfather   . Crohn's disease Daughter   . Colon cancer Neg Hx     Social History   Tobacco Use  . Smoking status: Former Research scientist (life sciences)  . Smokeless tobacco: Never Used  Substance Use Topics  . Alcohol use: Yes    Comment: occasional wine  . Drug use: No    Prior to Admission medications   Medication Sig Start Date End Date Taking?  Authorizing Provider  cholecalciferol (VITAMIN D) 1000 units tablet Take 2,000 Units by mouth daily.    Yes [provider]  Multiple Vitamin (MULTIVITAMIN) tablet Take 1 tablet by mouth daily.     Yes [provider]    Current Facility-Administered Medications  Medication Dose Route Frequency Provider Last Rate Last Dose  . 0.9 %  sodium chloride infusion   Intravenous Continuous Francine Graven, DO 100 mL/hr at 07/04/18 5170    . acetaminophen (TYLENOL) tablet 650 mg  650 mg Oral Q6H PRN [name], DO       Or  . acetaminophen (TYLENOL) suppository 650 mg  650 mg Rectal Q6H PRN [name], DO      . enoxaparin (LOVENOX) injection 40 mg  40 mg Subcutaneous Q24H Jennette Kettle M, DO   40 mg at 07/03/18 2255  . HYDROmorphone (DILAUDID) injection 0.5 mg  0.5 mg Intravenous Q4H PRN [name], DO   0.5 mg at 07/04/18 0509  . lactated ringers bolus 1,000 mL  1,000 mL Intravenous Q8H PRN Michael Boston, MD      . lip balm (CARMEX) ointment 1 application  1 application Topical BID Michael Boston, MD   1 application at 01/74/94 2257  . magic mouthwash  15 mL Oral QID PRN Michael Boston, MD      . methocarbamol (ROBAXIN) 1,000 mg in dextrose 5 % 50 mL IVPB  1,000 mg Intravenous Q6H PRN Michael Boston, MD      . ondansetron [facility]) tablet 4 mg  4 mg Oral Q6H PRN [name], DO       Or  . ondansetron [facility]) injection 4 mg  4 mg Intravenous Q6H PRN [name], DO   4 mg at 07/03/18 2349  . prochlorperazine (COMPAZINE) injection 5-10 mg  5-10 mg Intravenous Q4H PRN Michael Boston, MD   10 mg at 07/04/18 4967    Allergies as of 07/03/2018 - Review Complete 07/03/2018  Allergen Reaction Noted  . Cyclobenzaprine  03/17/2016  . Nsaids  07/03/2018  . Ciprofloxacin  03/14/2007  . Codeine Nausea And Vomiting 08/04/2012  . Metronidazole Nausea And Vomiting   . Penicillins Other (See Comments) 09/20/2007  . Sulfa antibiotics Nausea And Vomiting 08/04/2012     Review of Systems:    Constitutional: No weight loss, fever or chills Skin: No rash  Cardiovascular: No chest pain Respiratory: No SOB Gastrointestinal: See HPI and otherwise negative Genitourinary: No dysuria  Neurological: No headache, dizziness or syncope Musculoskeletal: No new muscle or joint pain Hematologic: No bleeding  Psychiatric: No history of depression or anxiety    Physical Exam:  Vital signs in last 24 hours: Temp:  [97.8 F (36.6 C)-98.5 F (36.9 C)] 98.1 F (36.7 C) (02/20 0502) Pulse Rate:  [65-78] 65 (02/20 0502) Resp:  [16] 16 (02/20 0502) BP: (112-141)/(61-83) 112/61 (02/20 0502) SpO2:  [  94 %-100 %] 94 % (02/20 0502) Last BM Date: 07/04/18(small mushy BM in colostomy this AM) General:   Pleasant Caucasian female appears to be in NAD, Well developed, Well nourished, alert and cooperative Head:  Normocephalic and atraumatic. Eyes:   PEERL, EOMI. No icterus. Conjunctiva pink. Ears:  Normal auditory acuity. Neck:  Supple Throat: Oral cavity and pharynx without inflammation, swelling or lesion.  Lungs: Respirations even and unlabored. Lungs clear to auscultation bilaterally.   No wheezes, crackles, or rhonchi.  Heart: Normal S1, S2. No MRG. Regular rate and rhythm. No peripheral edema, cyanosis or pallor.  Abdomen:  Soft, mild distension, moderate generalized ttp, No rebound or guarding. Decreased BS all four quadrants. No appreciable masses or hepatomegaly. + ostomy with small amount of brown stool Rectal:  Not performed.  Msk:  Symmetrical without gross deformities. Peripheral pulses intact.  Extremities:  Without edema, no deformity or joint abnormality. Neurologic:  Alert and  oriented x4;  grossly normal neurologically.  Skin:   Dry and intact without significant lesions or rashes. Psychiatric: Demonstrates good judgement and reason without abnormal affect or behaviors.  LAB RESULTS: Recent Labs    07/03/18 1537 07/04/18 0550  WBC 8.3 4.5  HGB  12.6 10.6*  HCT 38.4 33.5*  PLT 294 215   BMET Recent Labs    07/03/18 1537 07/04/18 0550  NA 137 139  K 3.6 3.4*  CL 101 110  CO2 26 23  GLUCOSE 125* 102*  BUN 10 11  CREATININE 0.71 0.61  CALCIUM 9.3 8.2*   LFT Recent Labs    07/03/18 1537  PROT 8.0  ALBUMIN 4.6  AST 27  ALT 24  ALKPHOS 109  BILITOT 0.9   STUDIES: Dg Chest 2 View  Result Date: 07/03/2018 CLINICAL DATA:  Abdominal pain, nausea and vomiting. Status post left hemicolectomy with colostomy. Decreased activity of the ostomy. EXAM: CHEST - 2 VIEW COMPARISON:  Two-view chest x-ray 07/21/2009 FINDINGS: Heart size is normal. Lungs are clear. The visualized soft tissues and bony thorax are unremarkable. IMPRESSION: Negative two view chest x-ray Electronically Signed   By: San Morelle M.D.   On: 07/03/2018 17:49   Ct Abdomen Pelvis W Contrast  Result Date: 07/03/2018 CLINICAL DATA:  68 y/o F; history of Crohn's disease with ostomy placement. No output of the ostomy for 2 days. EXAM: CT ABDOMEN AND PELVIS WITH CONTRAST TECHNIQUE: Multidetector CT imaging of the abdomen and pelvis was performed using the standard protocol following bolus administration of intravenous contrast. CONTRAST:  100 cc Isovue-300 COMPARISON:  07/08/2009 CT abdomen and pelvis. 11/22/2015 MRI of the abdomen. FINDINGS: Lower chest: No acute abnormality. Hepatobiliary: Stable subcentimeter cyst within the dome of the liver. Otherwise no focal liver abnormality is seen. No gallstones, gallbladder wall thickening, or biliary dilatation. Pancreas: Unremarkable. No pancreatic ductal dilatation or surrounding inflammatory changes. Spleen: Normal in size without focal abnormality. Adrenals/Urinary Tract: Adrenal glands are unremarkable. Kidneys are normal, without renal calculi, focal lesion, or hydronephrosis. Bladder is unremarkable. Stomach/Bowel: Right lower quadrant ileostomy. There is a parastomal hernia containing loops of small bowel and there  is fecalization of small bowel contents just upstream to the ostomy (series 2, image 60). There is mild diffuse distention of small bowel. No bowel wall thickening. Normal appearance of the stomach. Small bowel anastomosis noted in the mid abdomen. Colectomy. Unremarkable appearance of rectal pouch. Vascular/Lymphatic: Aortic atherosclerosis. No enlarged abdominal or pelvic lymph nodes. Reproductive: Status post hysterectomy. No adnexal masses. Other: No abdominal wall hernia  or abnormality. No abdominopelvic ascites. Musculoskeletal: No fracture is seen. Mild dextrocurvature of the lumbar spine. Stable L5-S1 grade 1 anterolisthesis with advanced facet arthropathy. IMPRESSION: Right lower quadrant ileostomy. There is fecalization of small bowel contents at the ostomy and mild upstream dilatation of small bowel which may reflect stricture or partial obstruction. No inflammatory changes of the bowel identified. Electronically Signed   By: Kristine Garbe M.D.   On: 07/03/2018 18:24    Impression / Plan:   Impression: 1.  SBO: General surgery following, think it may been a parastomal hernia which was reduced at bedside, now producing small amt of brown stool after drinking Gastrografin for planned SB imaging later today; most likely from parastomal hernia  2.  History of Crohn's disease: Status post proctocolectomy with ileostomy, no evidence of active inflammation on CT, last colonoscopy 12/30/2015 no severe intrinsic stenosis in the distal sigmoid which was not traversed, thought related to chronic Crohn's disease, discontinuous areas of ulcerated mucosa in the rectum and rectosigmoid colon, referred to Kem Parkinson at [facility] and underwent proctocolectomy with end ileostomy 03/21/2016  Plan: 1.  Added CRP and ESR today 2.  Continue plans as per surgery 3.  We will await results of small bowel imaging later today, if signs of inflammation or question of active Crohn's disease, could consider  scoping 4.  Please await any further recommendations from Dr. Fuller Plan later today  Thank you for your kind consultation, we will continue to follow.  Lavone Nian [facility]  07/04/2018, 9:53 AM     Attending Physician Note   I have taken a history, examined the patient and reviewed the chart. I agree with the Advanced Practitioner's note, impression and recommendations. SBO improving, due to parastomal hernia or adhesions. History of Crohn's however no imaging or recent symptoms to suggest Crohn's activity. Mgmt plans per surgery. GI available if needed.    Lucio Edward, MD FACG [phone]

## 2018-07-04 NOTE — Progress Notes (Signed)
PROGRESS NOTE  Lauren Figueroa SWF:[id number] DOB: May 15, 1951 DOA: 07/03/2018 PCP: Patient, No Pcp Per   LOS: 1 day   Brief Narrative / Interim history: 68 year old female with history of fistula arising Crohn's disease of colon status post proctocolectomy with ileostomy in 2017, currently not on medication presenting with abdominal pain, abdominal distention, nausea and emesis.  She had small amount of output in her ostomy in ED after drinking Gastrografin.  CT abdomen and pelvis showed fecal ideation of small bowel contents at the ostomy and mild upstream dilation of small bowel suspicious for stricture or partial obstruction but no inflammatory changes.  General surgery and gastroenterology consulted in ED. she did not need NG tube.  Subjective: No major events overnight of this morning.  Last emesis about 11 PM.  Attention has improved.  Denies nausea and abdominal pain.  Assessment & Plan: Principal Problem:   SBO (small bowel obstruction) at parastomal hernia Active Problems:   Crohn's disease ([place])   Crohn's colitis, with fistula, APR/SB resction/bladder repair/ileostomy 2017 at 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 -Appreciate general surgery and gastroenterology input -P.o. Gastrografin with 8-hour delay abdominal film -Holding off NG tube per patient's request   Crohn's disease: Status post proctocolectomy and ileostomy.  Not on medication.  CT abdomen did not show inflammatory change. -Continue ostomy care -Follow GI recommendations   Scheduled Meds: . enoxaparin (LOVENOX) injection  40 mg Subcutaneous Q24H  . lip balm  1 application  Topical BID   Continuous Infusions: . sodium chloride 100 mL/hr at 07/04/18 0826  . lactated ringers    . methocarbamol (ROBAXIN) IV     PRN Meds:.acetaminophen **OR** acetaminophen, HYDROmorphone (DILAUDID) injection, lactated ringers, magic mouthwash, methocarbamol (ROBAXIN) IV, ondansetron **OR** ondansetron (ZOFRAN) IV, promethazine  DVT prophylaxis: SCD Code Status: Full code Family Communication: None at bedside Disposition Plan: Remains inpatient  Consultants:   General surgery  Gastroenterology  Procedures:   Bedside hernia reduction by general surgery  Antimicrobials:  None  Objective: Vitals:   07/03/18 1525 07/03/18 1700 07/03/18 2225 07/04/18 0502  BP: (!) 141/76 138/83 138/73 112/61  Pulse: 78 66 65 65  Resp: 16 16 16 16   Temp: 98.5 F (36.9 C)  97.8 F (36.6 C) 98.1 F (36.7 C)  TempSrc: Oral     SpO2: 100% 95% 98% 94%    Intake/Output Summary (Last 24 hours) at 07/04/2018 1120 Last data filed at 07/04/2018 0636 Gross per 24 hour  Intake 4780 ml  Output 200 ml  Net 4580 ml   There were no vitals filed for this visit.  Examination:  GENERAL: Appears well. No acute distress.  HEENT: MMM.  Vision and Hearing grossly intact.  NECK: Supple.  No JVD.  LUNGS:  No IWOB. Good air movement. CTAB.  HEART:  RRR. Heart sounds normal.  ABD: Bowel sounds present. Soft. Non tender.  Ileostomy status EXT:   no edema bilaterally.  SKIN: no apparent skin lesion.  NEURO: Awake, alert and oriented appropriately.  No gross deficit.  PSYCH: Calm. Normal affect.   Data Reviewed: I have independently reviewed following labs and imaging studies   CBC: Recent Labs  Lab 07/03/18  1537 07/04/18 0550  WBC 8.3 4.5  HGB 12.6 10.6*  HCT 38.4 33.5*  MCV 91.2 94.1  PLT 294 509   Basic Metabolic Panel: Recent Labs  Lab 07/03/18 1537 07/04/18 0550  NA 137 139  K 3.6 3.4*  CL 101 110  CO2 26 23  GLUCOSE 125* 102*  BUN 10 11  CREATININE 0.71 0.61  CALCIUM  9.3 8.2*   GFR: CrCl cannot be calculated (Unknown ideal weight.). Liver Function Tests: Recent Labs  Lab 07/03/18 1537  AST 27  ALT 24  ALKPHOS 109  BILITOT 0.9  PROT 8.0  ALBUMIN 4.6   Recent Labs  Lab 07/03/18 1537  LIPASE 38   No results for input(s): AMMONIA in the last 168 hours. Coagulation Profile: No results for input(s): INR, PROTIME in the last 168 hours. Cardiac Enzymes: No results for input(s): CKTOTAL, CKMB, CKMBINDEX, TROPONINI in the last 168 hours. BNP (last 3 results) No results for input(s): PROBNP in the last 8760 hours. HbA1C: No results for input(s): HGBA1C in the last 72 hours. CBG: No results for input(s): GLUCAP in the last 168 hours. Lipid Profile: No results for input(s): CHOL, HDL, LDLCALC, TRIG, CHOLHDL, LDLDIRECT in the last 72 hours. Thyroid Function Tests: No results for input(s): TSH, T4TOTAL, FREET4, T3FREE, THYROIDAB in the last 72 hours. Anemia Panel: No results for input(s): VITAMINB12, FOLATE, FERRITIN, TIBC, IRON, RETICCTPCT in the last 72 hours. Urine analysis:    Component Value Date/Time   COLORURINE YELLOW 07/03/2018 2358   APPEARANCEUR CLEAR 07/03/2018 2358   LABSPEC 1.034 (H) 07/03/2018 2358   PHURINE 6.0 07/03/2018 2358   GLUCOSEU NEGATIVE 07/03/2018 2358   GLUCOSEU NEGATIVE 11/23/2008 1217   HGBUR SMALL (A) 07/03/2018 2358   BILIRUBINUR NEGATIVE 07/03/2018 [place] 07/03/2018 2358   PROTEINUR NEGATIVE 07/03/2018 2358   UROBILINOGEN 0.2 11/23/2008 1217   NITRITE NEGATIVE 07/03/2018 2358   LEUKOCYTESUR SMALL (A) 07/03/2018 2358   Sepsis Labs: Invalid input(s): PROCALCITONIN, LACTICIDVEN  No results found for this or any previous visit (from the past 240 hour(s)).    Radiology Studies: Dg Chest 2 View  Result Date: 07/03/2018 CLINICAL DATA:  Abdominal pain, nausea and vomiting. Status post left hemicolectomy with colostomy. Decreased activity of the ostomy. EXAM: CHEST - 2 VIEW COMPARISON:   Two-view chest x-ray 07/21/2009 FINDINGS: Heart size is normal. Lungs are clear. The visualized soft tissues and bony thorax are unremarkable. IMPRESSION: Negative two view chest x-ray Electronically Signed   By: San Morelle M.D.   On: 07/03/2018 17:49   Ct Abdomen Pelvis W Contrast  Result Date: 07/03/2018 CLINICAL DATA:  68 y/o F; history of Crohn's disease with ostomy placement. No output of the ostomy for 2 days. EXAM: CT ABDOMEN AND PELVIS WITH CONTRAST TECHNIQUE: Multidetector CT imaging of the abdomen and pelvis was performed using the standard protocol following bolus administration of intravenous contrast. CONTRAST:  100 cc Isovue-300 COMPARISON:  07/08/2009 CT abdomen and pelvis. 11/22/2015 MRI of the abdomen. FINDINGS: Lower chest: No acute abnormality. Hepatobiliary: Stable subcentimeter cyst within the dome of the liver. Otherwise no focal liver abnormality is seen. No gallstones, gallbladder wall thickening, or biliary dilatation. Pancreas: Unremarkable. No pancreatic ductal dilatation or surrounding inflammatory changes. Spleen: Normal in size without focal abnormality. Adrenals/Urinary Tract: Adrenal glands are unremarkable. Kidneys are normal, without renal calculi, focal lesion, or hydronephrosis. Bladder is unremarkable. Stomach/Bowel: Right lower quadrant ileostomy. There is a parastomal hernia containing loops of small bowel and there is fecalization  of small bowel contents just upstream to the ostomy (series 2, image 60). There is mild diffuse distention of small bowel. No bowel wall thickening. Normal appearance of the stomach. Small bowel anastomosis noted in the mid abdomen. Colectomy. Unremarkable appearance of rectal pouch. Vascular/Lymphatic: Aortic atherosclerosis. No enlarged abdominal or pelvic lymph nodes. Reproductive: Status post hysterectomy. No adnexal masses. Other: No abdominal wall hernia or abnormality. No abdominopelvic ascites. Musculoskeletal: No fracture is  seen. Mild dextrocurvature of the lumbar spine. Stable L5-S1 grade 1 anterolisthesis with advanced facet arthropathy. IMPRESSION: Right lower quadrant ileostomy. There is fecalization of small bowel contents at the ostomy and mild upstream dilatation of small bowel which may reflect stricture or partial obstruction. No inflammatory changes of the bowel identified. Electronically Signed   By: Kristine Garbe M.D.   On: 07/03/2018 18:24      Taye T. [facility] Pager [phone]  If 7PM-7AM, please contact night-coverage [email or web address] Password [email or web address] 07/04/2018, 11:20 AM

## 2018-07-05 ENCOUNTER — Inpatient Hospital Stay (HOSPITAL_COMMUNITY): Payer: BLUE CROSS/BLUE SHIELD

## 2018-07-05 DIAGNOSIS — E876 Hypokalemia: Secondary | ICD-10-CM

## 2018-07-05 LAB — CBC
HCT: 31.8 % — ABNORMAL LOW (ref 36.0–46.0)
Hemoglobin: 9.9 g/dL — ABNORMAL LOW (ref 12.0–15.0)
MCH: 30 pg (ref 26.0–34.0)
MCHC: 31.1 g/dL (ref 30.0–36.0)
MCV: 96.4 fL (ref 80.0–100.0)
Platelets: 193 10*3/uL (ref 150–400)
RBC: 3.3 MIL/uL — AB (ref 3.87–5.11)
RDW: 13.7 % (ref 11.5–15.5)
WBC: 4.5 10*3/uL (ref 4.0–10.5)
nRBC: 0 % (ref 0.0–0.2)

## 2018-07-05 LAB — COMPREHENSIVE METABOLIC PANEL
ALK PHOS: 61 U/L (ref 38–126)
ALT: 17 U/L (ref 0–44)
AST: 18 U/L (ref 15–41)
Albumin: 3.2 g/dL — ABNORMAL LOW (ref 3.5–5.0)
Anion gap: 12 (ref 5–15)
BUN: 13 mg/dL (ref 8–23)
CO2: 18 mmol/L — ABNORMAL LOW (ref 22–32)
CREATININE: 0.61 mg/dL (ref 0.44–1.00)
Calcium: 8 mg/dL — ABNORMAL LOW (ref 8.9–10.3)
Chloride: 110 mmol/L (ref 98–111)
GFR calc Af Amer: >60 mL/min (ref >60)
GFR calc non Af Amer: >60 mL/min (ref >60)
Glucose, Bld: 65 mg/dL — ABNORMAL LOW (ref 70–99)
Potassium: 3.4 mmol/L — ABNORMAL LOW (ref 3.5–5.1)
SODIUM: 140 mmol/L (ref 135–145)
Total Bilirubin: 1.2 mg/dL (ref 0.3–1.2)
Total Protein: 5.8 g/dL — ABNORMAL LOW (ref 6.5–8.1)

## 2018-07-05 MED ORDER — POTASSIUM CHLORIDE 10 MEQ/100ML IV SOLN
10.0000 meq | INTRAVENOUS | Status: DC
  Administered 2018-07-05: 10 meq via INTRAVENOUS
  Filled 2018-07-05: qty 100

## 2018-07-05 MED ORDER — POTASSIUM CHLORIDE 20 MEQ PO PACK
40.0000 meq | PACK | Freq: Two times a day (BID) | ORAL | Status: DC
  Administered 2018-07-05 (×2): 40 meq via ORAL
  Filled 2018-07-05 (×3): qty 2

## 2018-07-05 NOTE — Progress Notes (Signed)
PROGRESS NOTE  Lauren Figueroa XBL:[id number] DOB: [birth year] DOA: 07/03/2018 PCP: Patient, No Pcp Per   LOS: 2 days   Brief Narrative / Interim history: 68 year old female with history of fistula arising Crohn's disease of colon status post proctocolectomy with ileostomy in 2017, currently not on medication presenting with abdominal pain, abdominal distention, nausea and emesis.  She had small amount of output in her ostomy in ED after drinking Gastrografin.  CT abdomen and pelvis showed fecal ideation of small bowel contents at the ostomy and mild upstream dilation of small bowel suspicious for stricture or partial obstruction but no inflammatory changes.  General surgery and gastroenterology consulted in ED. She declined NG tube placement.  Subjective: Had a good ostomy output yesterday.  Tolerated clear liquid diet.  Still with mild abdominal pain.  Denies nausea or vomiting.  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  Had a good ostomy output on 2/20.  -Repeat abdominal film on 2/21 with air-filled loops of small and large bowel improved from prior -Appreciate general surgery and gastroenterology input -Advance to full liquid diet  Hypokalemia: Likely due to GI loss. -Replace and recheck.  Crohn's disease: Status post proctocolectomy and ileostomy.  Not on medication.  CT abdomen did not show inflammatory change. -Continue ostomy  care -Follow GI recommendations  Scheduled Meds: . enoxaparin (LOVENOX) injection  40 mg Subcutaneous Q24H  . lip balm  1 application Topical BID  . potassium chloride  40 mEq Oral BID   Continuous Infusions: . sodium chloride 100 mL/hr at 07/05/18 0433  . lactated ringers    . methocarbamol (ROBAXIN) IV     PRN Meds:.acetaminophen **OR** acetaminophen, HYDROmorphone (DILAUDID) injection, lactated ringers, magic mouthwash, methocarbamol (ROBAXIN) IV, ondansetron **OR** ondansetron (ZOFRAN) IV, promethazine  DVT prophylaxis: SCD Code Status: Full code Family Communication: None at bedside Disposition Plan: Remains inpatient.  Anticipate discharge in the next 24 hrs.  Consultants:   General surgery  Gastroenterology  Procedures:   Bedside hernia reduction by general surgery  Antimicrobials:  None  Objective: Vitals:   07/04/18 1417 07/04/18 2054 07/04/18 2217 07/05/18 0541  BP: 133/72 94/73 (!) 121/57 123/61  Pulse: 75 80 71 76  Resp: 17 15 16 15   Temp: 98.1 F (36.7 C) 98.4 F (36.9 C)  98.2 F (36.8 C)  TempSrc: Oral Oral  Oral  SpO2: 92% 95% 96% 94%  Weight:   65 kg   Height:   5' 3"  (1.6 m)     Intake/Output Summary (Last 24 hours) at 07/05/2018 1228 Last data filed at 07/05/2018 0600 Gross per 24 hour  Intake 2109.06 ml  Output 2950 ml  Net -840.94 ml   Filed Weights   07/04/18 2217  Weight: 65 kg    Examination:  GENERAL: Appears well. No acute distress.  HEENT: MMM.  Vision and Hearing grossly intact.  NECK: Supple.  No JVD.  LUNGS:  No IWOB. Good air movement. CTAB.  HEART:  RRR. Heart sounds normal.  ABD: Bowel sounds present. Soft. Non tender.  Ileostomy status EXT:   no edema bilaterally.  SKIN: no apparent skin lesion.  NEURO: Awake, alert and oriented appropriately.  No gross deficit.  PSYCH: Calm. Normal affect.   Data Reviewed: I have independently reviewed following labs and imaging studies   CBC: Recent Labs  Lab  August 01, 2018 1537 07/04/18 0550 07/05/18 0535  WBC 8.3 4.5 4.5  HGB 12.6 10.6* 9.9*  HCT 38.4 33.5* 31.8*  MCV 91.2 94.1 96.4  PLT 294 215 778   Basic Metabolic Panel: Recent Labs  Lab 01-Aug-2018 1537 07/04/18 0550 07/05/18 0535  NA 137 139 140  K 3.6 3.4* 3.4*  CL 101 110 110  CO2 26 23 18*  GLUCOSE 125* 102* 65*  BUN 10 11 13   CREATININE 0.71 0.61 0.61  CALCIUM 9.3 8.2* 8.0*   GFR: Estimated Creatinine Clearance: 61 mL/min (by C-G formula based on SCr of 0.61 mg/dL). Liver Function Tests: Recent Labs  Lab 01-Aug-2018 1537 07/05/18 0535  AST 27 18  ALT 24 17  ALKPHOS 109 61  BILITOT 0.9 1.2  PROT 8.0 5.8*  ALBUMIN 4.6 3.2*   Recent Labs  Lab 08-01-18 1537  LIPASE 38   No results for input(s): AMMONIA in the last 168 hours. Coagulation Profile: No results for input(s): INR, PROTIME in the last 168 hours. Cardiac Enzymes: No results for input(s): CKTOTAL, CKMB, CKMBINDEX, TROPONINI in the last 168 hours. BNP (last 3 results) No results for input(s): PROBNP in the last 8760 hours. HbA1C: No results for input(s): HGBA1C in the last 72 hours. CBG: No results for input(s): GLUCAP in the last 168 hours. Lipid Profile: No results for input(s): CHOL, HDL, LDLCALC, TRIG, CHOLHDL, LDLDIRECT in the last 72 hours. Thyroid Function Tests: No results for input(s): TSH, T4TOTAL, FREET4, T3FREE, THYROIDAB in the last 72 hours. Anemia Panel: No results for input(s): VITAMINB12, FOLATE, FERRITIN, TIBC, IRON, RETICCTPCT in the last 72 hours. Urine analysis:    Component Value Date/Time   COLORURINE YELLOW 2018/08/01 2358   APPEARANCEUR CLEAR 2018-08-01 2358   LABSPEC 1.034 (H) 2018/08/01 2358   PHURINE 6.0 01-Aug-2018 2358   GLUCOSEU NEGATIVE 08-01-2018 2358   GLUCOSEU NEGATIVE 11/23/2008 1217   HGBUR SMALL (A) 08/01/18 2358   BILIRUBINUR NEGATIVE August 01, 2018 [place] 08-01-18 2358   PROTEINUR NEGATIVE 08/01/18 2358   UROBILINOGEN 0.2 11/23/2008 1217    NITRITE NEGATIVE 08/01/2018 2358   LEUKOCYTESUR SMALL (A) 2018-08-01 2358   Sepsis Labs: Invalid input(s): PROCALCITONIN, LACTICIDVEN  No results found for this or any previous visit (from the past 240 hour(s)).    Radiology Studies: Dg Abd Portable 1v  Result Date: 07/05/2018 CLINICAL DATA:  Abdominal distention. Discomfort. Small-bowel obstruction. EXAM: PORTABLE ABDOMEN - 1 VIEW COMPARISON:  07/04/2018.  CT 08-01-18. FINDINGS: Air-filled loops of small and large bowel are again noted. Bowel distention has improved on today's exam. Ostomy noted right lower quadrant. No free air. Degenerative changes scoliosis lumbar spine concave left. IMPRESSION: Air-filled loops of small and large bowel are again noted. Bowel distention has improved on today's exam. Continued follow-up exams suggested to demonstrate complete resolution. Ostomy noted right lower quadrant. Electronically Signed   By: Marcello Moores  Register   On: 07/05/2018 08:44   Dg Abd Portable 1v-small Bowel Obstruction Protocol-initial, 8 Hr Delay  Result Date: 07/04/2018 CLINICAL DATA:  68 year old female with a history of small bowel obstruction EXAM: PORTABLE ABDOMEN - 1 VIEW COMPARISON:  CT 2018-08-01 FINDINGS: Gas within stomach  and small bowel. Borderline distention of loops in the mid abdomen. Fluid-filled small bowel loops within the right abdomen. No displaced fracture. IMPRESSION: Borderline dilated small bowel loops within the mid abdomen and right abdomen, may represent ileus or obstruction. Electronically Signed   By: Corrie Mckusick D.O.   On: 07/04/2018 16:51     [facility] Pager [phone]  If 7PM-7AM, please contact night-coverage [email or web address] Password [facility] 07/05/2018, 12:28 PM

## 2018-07-05 NOTE — Progress Notes (Signed)
[facility] Progress Note     Subjective: CC: feels better Patient reports she feels much better. Has had > 1L out of ileostomy. Denies nausea. Vomited small amount once after gastrografin but none since. Has not needed any pain medication since midday yesterday.   Objective: Vital signs in last 24 hours: Temp:  [98.1 F (36.7 C)-98.4 F (36.9 C)] 98.2 F (36.8 C) (02/21 0541) Pulse Rate:  [71-80] 76 (02/21 0541) Resp:  [15-17] 15 (02/21 0541) BP: (94-133)/(57-73) 123/61 (02/21 0541) SpO2:  [92 %-96 %] 94 % (02/21 0541) Weight:  [65 kg] 65 kg (02/20 2217) Last BM Date: 07/04/18  Intake/Output from previous day: 02/20 0701 - 02/21 0700 In: 2109.1 [P.O.:90; I.V.:2019.1] Out: 2950 [Urine:750; Stool:2200] Intake/Output this shift: No intake/output data recorded.  PE: Gen:  Alert, NAD, pleasant Card:  Regular rate and rhythm Pulm:  Normal effort, clear to auscultation bilaterally Abd: Soft, non-tender, non-distended, +BS, stoma pink with liquid output Skin: warm and dry, no rashes  Psych: A&Ox3    Lab Results:  Recent Labs    07/04/18 0550 07/05/18 0535  WBC 4.5 4.5  HGB 10.6* 9.9*  HCT 33.5* 31.8*  PLT 215 193   BMET Recent Labs    07/04/18 0550 07/05/18 0535  NA 139 140  K 3.4* 3.4*  CL 110 110  CO2 23 18*  GLUCOSE 102* 65*  BUN 11 13  CREATININE 0.61 0.61  CALCIUM 8.2* 8.0*   PT/INR No results for input(s): LABPROT, INR in the last 72 hours. CMP     Component Value Date/Time   NA 140 07/05/2018 0535   K 3.4 (L) 07/05/2018 0535   CL 110 07/05/2018 0535   CO2 18 (L) 07/05/2018 0535   GLUCOSE 65 (L) 07/05/2018 0535   BUN 13 07/05/2018 0535   CREATININE 0.61 07/05/2018 0535   CALCIUM 8.0 (L) 07/05/2018 0535   PROT 5.8 (L) 07/05/2018 0535   ALBUMIN 3.2 (L) 07/05/2018 0535   AST 18 07/05/2018 0535   ALT 17 07/05/2018 0535   ALKPHOS 61 07/05/2018 0535   BILITOT 1.2 07/05/2018 0535   GFRNONAA >60 07/05/2018 0535   GFRAA >60 07/05/2018  0535   Lipase     Component Value Date/Time   LIPASE 38 07/03/2018 1537       Studies/Results: Dg Chest 2 View  Result Date: 07/03/2018 CLINICAL DATA:  Abdominal pain, nausea and vomiting. Status post left hemicolectomy with colostomy. Decreased activity of the ostomy. EXAM: CHEST - 2 VIEW COMPARISON:  Two-view chest x-ray 07/21/2009 FINDINGS: Heart size is normal. Lungs are clear. The visualized soft tissues and bony thorax are unremarkable. IMPRESSION: Negative two view chest x-ray Electronically Signed   By: San Morelle M.D.   On: 07/03/2018 17:49   Ct Abdomen Pelvis W Contrast  Result Date: 07/03/2018 CLINICAL DATA:  68 y/o F; history of Crohn's disease with ostomy placement. No output of the ostomy for 2 days. EXAM: CT ABDOMEN AND PELVIS WITH CONTRAST TECHNIQUE: Multidetector CT imaging of the abdomen and pelvis was performed using the standard protocol following bolus administration of intravenous contrast. CONTRAST:  100 cc Isovue-300 COMPARISON:  07/08/2009 CT abdomen and pelvis. 11/22/2015 MRI of the abdomen. FINDINGS: Lower chest: No acute abnormality. Hepatobiliary: Stable subcentimeter cyst within the dome of the liver. Otherwise no focal liver abnormality is seen. No gallstones, gallbladder wall thickening, or biliary dilatation. Pancreas: Unremarkable. No pancreatic ductal dilatation or surrounding inflammatory changes. Spleen: Normal in size without focal abnormality. Adrenals/Urinary Tract: Adrenal  glands are unremarkable. Kidneys are normal, without renal calculi, focal lesion, or hydronephrosis. Bladder is unremarkable. Stomach/Bowel: Right lower quadrant ileostomy. 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 There is mild diffuse distention of small bowel. No bowel wall thickening. Normal appearance of the stomach. Small bowel anastomosis noted in the mid  abdomen. Colectomy. Unremarkable appearance of rectal pouch. Vascular/Lymphatic: Aortic atherosclerosis. No enlarged abdominal or pelvic lymph nodes. Reproductive: Status post hysterectomy. No adnexal masses. Other: No abdominal wall hernia or abnormality. No abdominopelvic ascites. Musculoskeletal: No fracture is seen. Mild dextrocurvature of the lumbar spine. Stable L5-S1 grade 1 anterolisthesis with advanced facet arthropathy. IMPRESSION: Right lower quadrant ileostomy. There is fecalization of small bowel contents at the ostomy and mild upstream dilatation of small bowel which may reflect stricture or partial obstruction. No inflammatory changes of the bowel identified. Electronically Signed   By: Kristine Garbe M.D.   On: 07/03/2018 18:24   Dg Abd Portable 1v-small Bowel Obstruction Protocol-initial, 8 Hr Delay  Result Date: 07/04/2018 CLINICAL DATA:  68 year old female with a history of small bowel obstruction EXAM: PORTABLE ABDOMEN - 1 VIEW COMPARISON:  CT 07/03/2018 FINDINGS: Gas within stomach and small bowel. Borderline distention of loops in the mid abdomen. Fluid-filled small bowel loops within the right abdomen. No displaced fracture. IMPRESSION: Borderline dilated small bowel loops within the mid abdomen and right abdomen, may represent ileus or obstruction. Electronically Signed   By: Corrie Mckusick D.O.   On: 07/04/2018 16:51    Anti-infectives: Anti-infectives (From admission, onward)   None       Assessment/Plan Hx of Crohn's diease - no signs of active Crohn's   Parastomal hernia with SBO - clinically improving - start CLD and may be able to advance to FLD this afternoon  - hernia remains reduced - film yesterday with borderline dilated loops of small bowel - repeat film this AM - mobilize as able Hypokalemia - K3.4, mild, replacement per primary service  FEN: CLD, IVF VTE: SCDs, lovenox ID: no abx indicated at this time  LOS: 2 days    Brigid Re ,  [facility] 07/05/2018, 8:14 AM Pager: [phone] Consults: [phone]

## 2018-07-05 NOTE — Care Management Important Message (Signed)
Important Message  Patient Details  Name: AQUEELAH COTRELL MRN: [id number] Date of Birth: 11/01/1950   Medicare Important Message Given:  Yes    Kerin Salen 07/05/2018, 3:49 [place] Message  Patient Details  Name: NAYELLI INGLIS MRN: [id number] Date of Birth: 1951/05/10   Medicare Important Message Given:  Yes    Kerin Salen 07/05/2018, 3:49 PM

## 2018-07-05 NOTE — Discharge Instructions (Signed)
Hernia, Adult     A hernia happens when tissue inside your body pushes out through a weak spot in your belly muscles (abdominal wall). This makes a round lump (bulge). The lump may be:  In a scar from surgery that was done in your belly (incisional hernia).  Near your belly button (umbilical hernia).  In your groin (inguinal hernia). Your groin is the area where your leg meets your lower belly (abdomen). This kind of hernia could also be: ? In your scrotum, if you are female. ? In folds of skin around your vagina, if you are female.  In your upper thigh (femoral hernia).  Inside your belly (hiatal hernia). This happens when your stomach slides above the muscle between your belly and your chest (diaphragm). If your hernia is small and it does not cause pain, you may not need treatment. If your hernia is large or it causes pain, you may need surgery. Follow these instructions at home: Activity  Avoid stretching or overusing (straining) the muscles near your hernia. Straining can happen when you: ? Lift something heavy. ? Poop (have a bowel movement).  Do not lift anything that is heavier than 10 lb (4.5 kg), or the limit that you are told, until your doctor says that it is safe.  Use the strength of your legs when you lift something heavy. Do not use only your back muscles to lift. General instructions  Do these things if told by your doctor so you do not have trouble pooping (constipation): ? Drink enough fluid to keep your pee (urine) pale yellow. ? Eat foods that are high in fiber. These include fresh fruits and vegetables, whole grains, and beans. ? Limit foods that are high in fat and processed sugars. These include foods that are fried or sweet. ? Take medicine for trouble pooping.  When you cough, try to cough gently.  You may try to push your hernia in by very gently pressing on it when you are lying down. Do not try to force the bulge back in if it will not push in  easily.  If you are overweight, work with your doctor to lose weight safely.  Do not use any products that have nicotine or tobacco in them. These include cigarettes and e-cigarettes. If you need help quitting, ask your doctor.  If you will be having surgery (hernia repair), watch your hernia for changes in shape, size, or color. Tell your doctor if you see any changes.  Take over-the-counter and prescription medicines only as told by your doctor.  Keep all follow-up visits as told by your doctor. Contact a doctor if:  You get new pain, swelling, or redness near your hernia.  You poop fewer times in a week than normal.  You have trouble pooping.  You have poop (stool) that is more dry than normal.  You have poop that is harder or larger than normal. Get help right away if:  You have a fever.  You have belly pain that gets worse.  You feel sick to your stomach (nauseous).  You throw up (vomit).  Your hernia cannot be pushed in by very gently pressing on it when you are lying down. Do not try to force the bulge back in if it will not push in easily.  Your hernia: ? Changes in shape or size. ? Changes color. ? Feels hard or it hurts when you touch it. These symptoms may represent a serious problem that is an emergency. Do not  wait to see if the symptoms will go away. Get medical help right away. Call your local emergency services (911 in the [place] Summary  A hernia happens when tissue inside your body pushes out through a weak spot in the belly muscles. This creates a bulge.  If your hernia is small and it does not hurt, you may not need treatment. If your hernia is large or it hurts, you may need surgery.  If you will be having surgery, watch your hernia for changes in shape, size, or color. Tell your doctor about any changes. This information is not intended to replace advice given to you by your health care provider. Make sure you discuss any questions you have with  your health care provider. Document Released: 10/19/2009 Document Revised: 01/31/2017 Document Reviewed: 01/31/2017 Elsevier Interactive Patient Education  2019 [employer].

## 2018-07-06 LAB — BASIC METABOLIC PANEL
Anion gap: 6 (ref 5–15)
BUN: <5 mg/dL — ABNORMAL LOW (ref 8–23)
CO2: 25 mmol/L (ref 22–32)
Calcium: 8.9 mg/dL (ref 8.9–10.3)
Chloride: 108 mmol/L (ref 98–111)
Creatinine, Ser: 0.52 mg/dL (ref 0.44–1.00)
GFR calc Af Amer: >60 mL/min (ref >60)
GFR calc non Af Amer: >60 mL/min (ref >60)
Glucose, Bld: 94 mg/dL (ref 70–99)
Potassium: 3.7 mmol/L (ref 3.5–5.1)
Sodium: 139 mmol/L (ref 135–145)

## 2018-07-06 LAB — CBC
HCT: 37.3 % (ref 36.0–46.0)
Hemoglobin: 11.8 g/dL — ABNORMAL LOW (ref 12.0–15.0)
MCH: 29.5 pg (ref 26.0–34.0)
MCHC: 31.6 g/dL (ref 30.0–36.0)
MCV: 93.3 fL (ref 80.0–100.0)
Platelets: 238 10*3/uL (ref 150–400)
RBC: 4 MIL/uL (ref 3.87–5.11)
RDW: 13.5 % (ref 11.5–15.5)
WBC: 4.3 10*3/uL (ref 4.0–10.5)
nRBC: 0 % (ref 0.0–0.2)

## 2018-07-06 NOTE — Plan of Care (Signed)
  Problem: Nutrition: Goal: Adequate nutrition will be maintained Outcome: Progressing   Problem: Elimination: Goal: Will not experience complications related to bowel motility Outcome: Progressing   Problem: Pain Managment: Goal: General experience of comfort will improve Outcome: Progressing   Problem: Safety: Goal: Ability to remain free from injury will improve Outcome: Progressing   Problem: Skin Integrity: Goal: Risk for impaired skin integrity will decrease Outcome: Progressing   Problem: Education: Goal: Knowledge of ostomy care will improve Outcome: Progressing Goal: Understanding of discharge needs will improve Outcome: Progressing   Problem: Bowel/Gastric/Urinary: Goal: Gastrointestinal status for postoperative course will improve Outcome: Progressing   Problem: Coping: Goal: Coping ability will improve Outcome: Progressing   Problem: Fluid Volume: Goal: Ability to achieve a balanced intake and output will improve Outcome: Progressing   Problem: Health Behavior/Discharge Planning: Goal: Ability to manage health-related needs will improve Outcome: Progressing   Problem: Nutrition: Goal: Will attain and maintain optimal nutritional status will improve Outcome: Progressing   Problem: Clinical Measurements: Goal: Postoperative complications will be avoided or minimized Outcome: Progressing   Problem: Skin Integrity: Goal: Will show signs of wound healing Outcome: Progressing Goal: Risk for impaired skin integrity will decrease Outcome: Progressing  Pt alert and oriented, ambulating in hall.  Passing gas, tolerating soft diet.  Pain well managed.  RN will monitor.

## 2018-07-06 NOTE — Plan of Care (Signed)

## 2018-07-06 NOTE — Progress Notes (Signed)
   Subjective/Chief Complaint: Had a little cramping abdominal pain last evening No nausea Good ostomy output On fulls   Objective: Vital signs in last 24 hours: Temp:  [98.7 F (37.1 C)-99.1 F (37.3 C)] 99.1 F (37.3 C) (02/22 0458) Pulse Rate:  [62-76] 71 (02/22 0458) Resp:  [16-17] 17 (02/21 2058) BP: (134-147)/(65-81) 134/65 (02/22 0458) SpO2:  [94 %-99 %] 94 % (02/22 0458) Last BM Date: 07/05/18  Intake/Output from previous day: 02/21 0701 - 02/22 0700 In: 1915.5 [P.O.:720; I.V.:1195.5] Out: 4350 [Urine:2950; MVVKP:2244] Intake/Output this shift: No intake/output data recorded.  Exam: Abdomen soft, non-tender, ostomy productive  Lab Results:  Recent Labs    07/04/18 0550 07/05/18 0535  WBC 4.5 4.5  HGB 10.6* 9.9*  HCT 33.5* 31.8*  PLT 215 193   BMET Recent Labs    07/04/18 0550 07/05/18 0535  NA 139 140  K 3.4* 3.4*  CL 110 110  CO2 23 18*  GLUCOSE 102* 65*  BUN 11 13  CREATININE 0.61 0.61  CALCIUM 8.2* 8.0*   PT/INR No results for input(s): LABPROT, INR in the last 72 hours. ABG No results for input(s): PHART, HCO3 in the last 72 hours.  Invalid input(s): PCO2, PO2  Studies/Results: Dg Abd Portable 1v  Result Date: 07/05/2018 CLINICAL DATA:  Abdominal distention. Discomfort. Small-bowel obstruction. EXAM: PORTABLE ABDOMEN - 1 VIEW COMPARISON:  07/04/2018.  CT 07/03/2018. FINDINGS: Air-filled loops of small and large bowel are again noted. Bowel distention has improved on today's exam. Ostomy noted right lower quadrant. No free air. Degenerative changes scoliosis lumbar spine concave left. IMPRESSION: Air-filled loops of small and large bowel are again noted. Bowel distention has improved on today's exam. Continued follow-up exams suggested to demonstrate complete resolution. Ostomy noted right lower quadrant. Electronically Signed   By: Marcello Moores  Register   On: 07/05/2018 08:44   Dg Abd Portable 1v-small Bowel Obstruction Protocol-initial, 8 Hr  Delay  Result Date: 07/04/2018 CLINICAL DATA:  68 year old female with a history of small bowel obstruction EXAM: PORTABLE ABDOMEN - 1 VIEW COMPARISON:  CT 07/03/2018 FINDINGS: Gas within stomach and small bowel. Borderline distention of loops in the mid abdomen. Fluid-filled small bowel loops within the right abdomen. No displaced fracture. IMPRESSION: Borderline dilated small bowel loops within the mid abdomen and right abdomen, may represent ileus or obstruction. Electronically Signed   By: Corrie Mckusick D.O.   On: 07/04/2018 16:51    Anti-infectives: Anti-infectives (From admission, onward)   None      Assessment/Plan: Resolving SBO, parastomal hernia  Start soft diet Ok from surgery standpoint to discharge if tolerated   LOS: 3 days    Coralie Keens 07/06/2018

## 2018-07-06 NOTE — Discharge Summary (Signed)
Physician Discharge Summary  Lauren Figueroa ZTI:[id number] DOB: 1951/04/29 DOA: 07/03/2018  PCP: Patient, No Pcp Per  Admit date: 07/03/2018 Discharge date: 07/06/2018  Admitted From: Home Disposition: Home  Recommendations for Outpatient Follow-up:  1. Follow up with general surgery and GI in 1-2 weeks 2. Please obtain BMP/CBC at follow-up. 3. Please follow up on the following pending results: None  Home Health: None Equipment/Devices: None  Discharge Condition: Stable CODE STATUS: Full code  Hospital Course: 68 year old female with history of fistula arising Crohn's disease of colon status post proctocolectomy with ileostomy in 2017, currently not on medication presenting with abdominal pain, abdominal distention, nausea and emesis.  She had small amount of output in her ostomy in ED after drinking Gastrografin.  CT abdomen and pelvis showed fecalization of small bowel contents at the ostomy and mild upstream dilation of small bowel suspicious for stricture or partial obstruction but no inflammatory changes.  General surgery and gastroenterology consulted in ED. She declined NG tube placement. Had parastomal hernia reduction by general surgery.  Had good ostomy output throughout her stay.  Abdominal film on 2/21 with air-filled loops of small and large bowel improved from prior. Advanced to full liquid diet that she tolerated.   On the day of discharge, patient tolerated soft diet that she tolerated well.  Symptoms resolved except for mild abdominal pain.  Felt ready to go home.  Cleared by general surgery for discharge.   See individual problem list below for more.  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   Continues to have good ostomy output.  Tolerated soft diet this morning.  Cleared by general surgery for discharge and outpatient follow-up.   Hypokalemia: Likely due to GI loss.  Resolved.  Crohn's disease: status post proctocolectomy and ileostomy.  Not on medication.  CT abdomen did not show inflammatory change.  GI did not feel there is an ongoing active Crohn's activity and signed off.  Outpatient follow-up with your GI in 2 weeks.  Discharge Instructions  Discharge Instructions    Diet - low sodium heart healthy   Complete by:  As directed    Discharge instructions   Complete by:  As directed    It has been a pleasure taking care of you! -You may advance your diet from soft to regular with pain as your guide. -Follow-up with general surgery and your gastroenterologist.  Once you are discharged, your primary care physician will handle any further medical issues. Please note that NO REFILLS for any discharge medications will be authorized once you are discharged, as it is imperative that you return to your primary care physician (or establish a relationship with a primary care physician if you do not have one) for your aftercare needs so that they can reassess your need for medications and monitor your lab values. Take care,   Increase activity slowly   Complete by:  As directed      Allergies as of 07/06/2018  Reactions   Cyclobenzaprine    Other reaction(s): Dizziness, hallucinations   Nsaids    Crohn's disease / IBD   Ciprofloxacin    REACTION: unspecified   Codeine Nausea And Vomiting   Metronidazole Nausea And Vomiting   Penicillins Other (See Comments)   Crawling out of my skin    Sulfa Antibiotics Nausea And Vomiting      Medication List    TAKE these medications   cholecalciferol 1000 units tablet Commonly known as:  VITAMIN D Take 2,000 Units by mouth daily.   multivitamin tablet Take 1 tablet by mouth daily.      Follow-up Information    Michael Boston, MD Follow up.   Specialty:  General Surgery Why:  Our office is working on scheduling a follow up appointment for you. Please call to confirm appointment date/time if you do not hear from [place] within a few days of discharge.  Contact information: [address] [place] [address] [phone]        Jerene Bears, MD. Schedule an appointment as soon as possible for a visit in 2 week(s).   Specialty:  Gastroenterology Contact information: [address] [place] [address] [phone]           Consultations:  General surgery  Gastroenterology  Procedures/Studies:  2D Echo: None obtained this admission  Dg Chest 2 View  Result Date: 07/03/2018 CLINICAL DATA:  Abdominal pain, nausea and vomiting. Status post left hemicolectomy with colostomy. Decreased activity of the ostomy. EXAM: CHEST - 2 VIEW COMPARISON:  Two-view chest x-ray 07/21/2009 FINDINGS: Heart size is normal. Lungs are clear. The visualized soft tissues and bony thorax are unremarkable. IMPRESSION: Negative two view chest x-ray Electronically Signed   By: San Morelle M.D.   On: 07/03/2018 17:49   Ct Abdomen Pelvis W Contrast  Result Date: 07/03/2018 CLINICAL DATA:  68 y/o F; history of Crohn's disease with ostomy placement. No output of the ostomy for 2 days. EXAM: CT ABDOMEN AND PELVIS WITH CONTRAST TECHNIQUE: Multidetector CT imaging of the abdomen and pelvis was performed using the standard protocol following bolus administration of intravenous contrast. CONTRAST:  100 cc Isovue-300 COMPARISON:  07/08/2009 CT abdomen and pelvis. 11/22/2015 MRI of the abdomen. FINDINGS: Lower chest: No acute  abnormality. Hepatobiliary: Stable subcentimeter cyst within the dome of the liver. Otherwise no focal liver abnormality is seen. No gallstones, gallbladder wall thickening, or biliary dilatation. Pancreas: Unremarkable. No pancreatic ductal dilatation or surrounding inflammatory changes. Spleen: Normal in size without focal abnormality. Adrenals/Urinary Tract: Adrenal glands are unremarkable. Kidneys are normal, without renal calculi, focal lesion, or hydronephrosis. Bladder is unremarkable. Stomach/Bowel: Right lower quadrant ileostomy. 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 There is mild diffuse distention of small bowel. No bowel wall thickening. Normal appearance of the stomach. Small bowel anastomosis noted in the mid abdomen. Colectomy. Unremarkable appearance of rectal pouch. Vascular/Lymphatic: Aortic atherosclerosis. No enlarged abdominal or pelvic lymph nodes. Reproductive: Status post hysterectomy. No adnexal masses. Other: No abdominal wall hernia or abnormality. No abdominopelvic ascites. Musculoskeletal: No fracture is seen. Mild dextrocurvature of the lumbar spine. Stable L5-S1 grade 1 anterolisthesis with advanced facet arthropathy. IMPRESSION: Right lower quadrant ileostomy. There is fecalization of small bowel contents at the ostomy and mild upstream dilatation of small bowel which may reflect stricture or partial obstruction. No inflammatory changes of the bowel  identified. Electronically Signed   By: Kristine Garbe M.D.   On: 07/03/2018 18:24   Dg Abd Portable 1v  Result Date: 07/05/2018 CLINICAL DATA:  Abdominal distention. Discomfort. Small-bowel obstruction. EXAM: PORTABLE ABDOMEN - 1 VIEW COMPARISON:  07/04/2018.  CT 07/03/2018. FINDINGS: Air-filled loops of small and large bowel are again noted. Bowel distention has improved on today's exam. Ostomy noted  right lower quadrant. No free air. Degenerative changes scoliosis lumbar spine concave left. IMPRESSION: Air-filled loops of small and large bowel are again noted. Bowel distention has improved on today's exam. Continued follow-up exams suggested to demonstrate complete resolution. Ostomy noted right lower quadrant. Electronically Signed   By: Marcello Moores  Register   On: 07/05/2018 08:44   Dg Abd Portable 1v-small Bowel Obstruction Protocol-initial, 8 Hr Delay  Result Date: 07/04/2018 CLINICAL DATA:  68 year old female with a history of small bowel obstruction EXAM: PORTABLE ABDOMEN - 1 VIEW COMPARISON:  CT 07/03/2018 FINDINGS: Gas within stomach and small bowel. Borderline distention of loops in the mid abdomen. Fluid-filled small bowel loops within the right abdomen. No displaced fracture. IMPRESSION: Borderline dilated small bowel loops within the mid abdomen and right abdomen, may represent ileus or obstruction. Electronically Signed   By: Corrie Mckusick D.O.   On: 07/04/2018 16:51      Subjective: No major events overnight.  Continues to have good ostomy output.  Feels some abdominal tenderness.  Her leg cramp last night.  No nausea or vomiting.  Would like to try soft diet today.   After lunch, patient tolerated soft diet and feels ready to go home and follow-up with general surgery and GI outpatient.  Husband at bedside.  Discharge Exam: Vitals:   07/05/18 2058 07/06/18 0458  BP: (!) 147/81 134/65  Pulse: 76 71  Resp: 17   Temp: 98.7 F (37.1 C) 99.1 F (37.3 C)  SpO2: 97% 94%    GENERAL: Appears well. No acute distress.  HEENT: MMM.  Vision and Hearing grossly intact.  NECK: Supple.  No JVD.  LUNGS:  No IWOB. Good air movement. CTAB.  HEART:  RRR. Heart sounds normal.  ABD: Bowel sounds present. Soft.  Mild tenderness. EXT:  no edema bilaterally.  SKIN: no apparent skin lesion.  NEURO: Awake, alert and oriented appropriately.  No gross deficit.  PSYCH: Calm. Normal affect.  The  results of significant diagnostics from this hospitalization (including imaging, microbiology, ancillary and laboratory) are listed below for reference.     Microbiology: No results found for this or any previous visit (from the past 240 hour(s)).   Labs: BNP (last 3 results) No results for input(s): BNP in the last 8760 hours. Basic Metabolic Panel: Recent Labs  Lab 07/03/18 1537 07/04/18 0550 07/05/18 0535 07/06/18 0817  NA 137 139 140 139  K 3.6 3.4* 3.4* 3.7  CL 101 110 110 108  CO2 26 23 18* 25  GLUCOSE 125* 102* 65* 94  BUN 10 11 13  <5*  CREATININE 0.71 0.61 0.61 0.52  CALCIUM 9.3 8.2* 8.0* 8.9   Liver Function Tests: Recent Labs  Lab 07/03/18 1537 07/05/18 0535  AST 27 18  ALT 24 17  ALKPHOS 109 61  BILITOT 0.9 1.2  PROT 8.0 5.8*  ALBUMIN 4.6 3.2*   Recent Labs  Lab 07/03/18 1537  LIPASE 38   No results for input(s): AMMONIA in the last 168 hours. CBC: Recent Labs  Lab 07/03/18 1537 07/04/18 0550 07/05/18 0535 07/06/18 0817  WBC 8.3 4.5 4.5 4.3  HGB  12.6 10.6* 9.9* 11.8*  HCT 38.4 33.5* 31.8* 37.3  MCV 91.2 94.1 96.4 93.3  PLT 294 215 193 238   Cardiac Enzymes: No results for input(s): CKTOTAL, CKMB, CKMBINDEX, TROPONINI in the last 168 hours. BNP: Invalid input(s): POCBNP CBG: No results for input(s): GLUCAP in the last 168 hours. D-Dimer No results for input(s): DDIMER in the last 72 hours. Hgb A1c No results for input(s): HGBA1C in the last 72 hours. Lipid Profile No results for input(s): CHOL, HDL, LDLCALC, TRIG, CHOLHDL, LDLDIRECT in the last 72 hours. Thyroid function studies No results for input(s): TSH, T4TOTAL, T3FREE, THYROIDAB in the last 72 hours.  Invalid input(s): FREET3 Anemia work up No results for input(s): VITAMINB12, FOLATE, FERRITIN, TIBC, IRON, RETICCTPCT in the last 72 hours. Urinalysis    Component Value Date/Time   COLORURINE YELLOW 07/03/2018 2358   APPEARANCEUR CLEAR 07/03/2018 2358   LABSPEC 1.034 (H)  07/03/2018 2358   PHURINE 6.0 07/03/2018 2358   GLUCOSEU NEGATIVE 07/03/2018 2358   GLUCOSEU NEGATIVE 11/23/2008 1217   HGBUR SMALL (A) 07/03/2018 2358   BILIRUBINUR NEGATIVE 07/03/2018 2358   [place] 07/03/2018 2358   PROTEINUR NEGATIVE 07/03/2018 2358   UROBILINOGEN 0.2 11/23/2008 1217   NITRITE NEGATIVE 07/03/2018 2358   LEUKOCYTESUR SMALL (A) 07/03/2018 2358   Sepsis Labs Invalid input(s): PROCALCITONIN,  WBC,  LACTICIDVEN   Time coordinating discharge: 25 minutes  SIGNED:  Mercy Riding, MD  [facility] 07/06/2018, 1:02 PM Pager [phone]  If 7PM-7AM, please contact night-coverage [email or web address] Password [email or web address]

## 2018-07-22 ENCOUNTER — Ambulatory Visit: Payer: Self-pay | Admitting: Surgery

## 2018-07-23 ENCOUNTER — Telehealth: Payer: Self-pay | Admitting: Internal Medicine

## 2018-07-23 NOTE — Telephone Encounter (Signed)
-----   Message from Jerene Bears, MD sent at 07/22/2018  8:25 PM EDT ----- Regarding: RE: Crohn pt w ileostomy parastomal hernia & recent incarceration w SBO. Delma Officer Thanks for your note I will see her and then get back to you.  May consider ileoscopy to ensure no IBD Willis Modena We need to see Mrs. Desa in followup JMP  ----- Message ----- From: Michael Boston, MD Sent: 07/22/2018   6:15 PM EDT To: Jerene Bears, MD Subject: Crohn pt w ileostomy parastomal hernia & rec#  Pleasant woman with severe Crohn's disease that required abdominal perineal resection with completion colectomy and permanent ileostomy at [facility] many years ago.  No strong evidence of recurrent Crohn's disease.Seen in [facility] emergency department with small bowel obstruction.  I reduced the parastomal hernia around her ileostomy last month.  She gradually improved.Standard of care would be parastomal hernia repair.  Reasonable laparoscopic approach with [place] underlay repair.  Combination of absorbable and permanent mesh to minimize erosion and minimize recurrence.I do think it would be a good idea to follow-up with you since she is overdue to see you anyway to make sure that there are no new Crohn's issues and you think she is a reasonable risk for that surgery.  She does not want to go through that type of pain or risk of incarceration again.

## 2018-07-23 NOTE — Telephone Encounter (Signed)
Pt called wanting to discuss her f/u [facility]sp visit with dr. Hilarie Fredrickson. For advice. She states that surgery was brought up and will like his input before going through with it. She would like for a call back from dr. Hilarie Fredrickson if possible.

## 2018-07-23 NOTE — Telephone Encounter (Signed)
I have spoken with patient and she will see Dr Hilarie Fredrickson on 08/02/2018 at 830 am.

## 2018-07-23 NOTE — Telephone Encounter (Signed)
Left message for patient to call back  

## 2018-07-25 ENCOUNTER — Encounter: Payer: Self-pay | Admitting: *Deleted

## 2018-07-26 ENCOUNTER — Telehealth (INDEPENDENT_AMBULATORY_CARE_PROVIDER_SITE_OTHER): Payer: BLUE CROSS/BLUE SHIELD | Admitting: Internal Medicine

## 2018-07-26 DIAGNOSIS — K433 Parastomal hernia with obstruction, without gangrene: Secondary | ICD-10-CM

## 2018-07-26 DIAGNOSIS — Z932 Ileostomy status: Secondary | ICD-10-CM | POA: Diagnosis not present

## 2018-07-26 DIAGNOSIS — K50912 Crohn's disease, unspecified, with intestinal obstruction: Secondary | ICD-10-CM | POA: Diagnosis not present

## 2018-07-26 DIAGNOSIS — K50113 Crohn's disease of large intestine with fistula: Secondary | ICD-10-CM

## 2018-07-26 NOTE — Telephone Encounter (Signed)
I contacted patient. She would like to ask Dr Hilarie Fredrickson "a couple of questions" when he has a chance.Marland KitchenMarland KitchenMarland Kitchen

## 2018-07-29 NOTE — Telephone Encounter (Signed)
I left patient a voicemail tonight  Will try her again tomorrow

## 2018-07-31 ENCOUNTER — Encounter: Payer: Self-pay | Admitting: Internal Medicine

## 2018-07-31 NOTE — Telephone Encounter (Signed)
Contacted by patient regarding her history of Crohn's disease status post total colectomy with permanent end ileostomy and recent partial bowel obstruction necessitating ER visit for parastomal hernia causing bowel obstruction.  I called her and performed a telephone encounter.  We spoke for 21 minutes.  Patient consented to our discussion by phone.  People present were patient, Irean Kendricks, and I. Chief complaint: History of Crohn's disease with permanent end ileostomy, recent parastomal hernia reduction and partial bowel obstruction  Relevant history is as stated above with permanent ileum ileostomy, results.  I reviewed the CT scan performed on 07/03/2018 at the time of her emergency room visit.  The CT scan showed the right lower quadrant ileostomy with equalization of small bowel content and mild upstream dilatation reflecting stricture or bowel obstruction.  There was no evidence of inflammation to suggest active Crohn's disease  She reports that she has been seen by Dr. Johney Maine who recommended parastomal hernia repair by laparoscopic approach with combination of absorbable and permanent mesh.  He also recommended consideration of excluding active Crohn's disease before surgery.  Subjective --since her ER visit she is back to her usual state of health.  Her ostomy is functioning well.  No abdominal pain.  No blood per ostomy.  No fevers or chills.  Assessment and plan: Parastomal hernia with recent partial obstruction --I do think it is reasonable to perform an ileoscopy to rule out active Crohn's disease in the distal small bowel leading up to the ileostomy.  This will also be pertinent information for Dr. Johney Maine prior to parastomal hernia repair.  Given the current pandemic with COVID-19 combined with her asymptomatic state currently, we are going to delay the ileoscopy for 4 to 6 weeks and proceed in mid to late April.  Should she develop symptoms she is asked to notify me immediately.  She  voices understanding and is happy with this plan.  CC Dr. Johney Maine

## 2018-07-31 NOTE — Telephone Encounter (Signed)
Patient has been scheduled for previsit and flexible sigmoidoscopy on 09/10/2018 at [facility]. Patient verbalizes understanding.

## 2018-08-02 ENCOUNTER — Ambulatory Visit: Payer: BLUE CROSS/BLUE SHIELD | Admitting: Internal Medicine

## 2018-08-12 ENCOUNTER — Ambulatory Visit: Payer: BLUE CROSS/BLUE SHIELD | Admitting: Internal Medicine

## 2018-09-02 ENCOUNTER — Ambulatory Visit (AMBULATORY_SURGERY_CENTER): Payer: BLUE CROSS/BLUE SHIELD | Admitting: *Deleted

## 2018-09-02 ENCOUNTER — Other Ambulatory Visit: Payer: Self-pay

## 2018-09-02 ENCOUNTER — Encounter: Payer: Self-pay | Admitting: Internal Medicine

## 2018-09-02 VITALS — Ht 64.0 in | Wt 128.0 lb

## 2018-09-02 DIAGNOSIS — Z8719 Personal history of other diseases of the digestive system: Secondary | ICD-10-CM

## 2018-09-02 NOTE — Progress Notes (Signed)
No egg or soy allergy known to patient  No issues with past sedation with any surgeries  or procedures, no intubation problems  No diet pills per patient No home 02 use per patient  No blood thinners per patient  Pt denies issues with constipation  No A fib or A flutter  Reviewed instructions via phone Informed pt. That instructions will be mailed.

## 2018-09-03 ENCOUNTER — Telehealth: Payer: Self-pay | Admitting: *Deleted

## 2018-09-03 NOTE — Telephone Encounter (Signed)
My understanding is that elective surgeries, which is what I believe her surgery with Dr. Johney Maine would be labeled, are on hold until after June 1. That said my recommendation would be that we perform her ileoscopy in mid May.  Please reschedule and be sure she is okay with this plan If I am wrong and Dr. Johney Maine is willing to proceed with surgery now, we can keep ileoscopy for next week

## 2018-09-03 NOTE — Telephone Encounter (Signed)
Left message for patient to call back  

## 2018-09-03 NOTE — Telephone Encounter (Signed)
Patient is unsure whether Dr Johney Maine planned to do surgery based on our findings or not on a more urgent basis. I will contact Dr Johney Maine' assistant tomorrow to find out their plans and will then contact patient to advise.Marland KitchenMarland KitchenMarland Kitchen

## 2018-09-03 NOTE — Telephone Encounter (Signed)
Spoke with patient about her appointment for a flex sig on 4/28.  She states that she is not yet scheduled for her surgery with Dr. Johney Maine.  She was of the understanding that she needed the flex sig prior to scheduling her surgery to know if she has any active crohns.  I advised her that I would reach out to Dr. Hilarie Fredrickson to advise if we should proceed with the flex sig.

## 2018-09-04 NOTE — Telephone Encounter (Signed)
I spoke with Abigail Butts at [facility]. She is going to be in touch with Elmo Putt, Dr Johney Maine' CMA tomorrow (she is not in office today) and will find out urgency of ileoscopy. Will let us know hopefully tomorrow at some point.

## 2018-09-05 NOTE — Telephone Encounter (Signed)
Dr Johney Maine' assistant indicates that Dr Johney Maine does not plan to do surgery before June 1st. Therefore,theoretically, procedure would be able to wait until May if we choose. However, since Dr Vena Rua last note, some restrictions for [facility] have been lifted and Dr Hilarie Fredrickson notes patient may have ileoscopy at this time if she wishes OR she can wait until May if more comfortable. She would prefer to have procedure now if possible but states that if she has another flare between time of ileoscopy and surgery, she wonders if she would be made to have another procedure... Dr Hilarie Fredrickson, please advise.Marland KitchenMarland KitchenMarland Kitchen

## 2018-09-09 ENCOUNTER — Telehealth: Payer: Self-pay | Admitting: *Deleted

## 2018-09-09 NOTE — Telephone Encounter (Signed)
Covid-19 travel screening questions  Have you traveled in the last 14 days? no If yes where?  Do you now or have you had a fever in the last 14 days? no  Do you have any respiratory symptoms of shortness of breath or cough now or in the last 14 days? no  Do you have any family members or close contacts with diagnosed or suspected Covid-19?  Pt is aware that care partner will be staying in the car during her procedure

## 2018-09-09 NOTE — Telephone Encounter (Signed)
I have spoken to patient to advise that Dr Hilarie Fredrickson thinks she would be okay to have procedure at this time as if she were to have another flare, it would likely be related to the stoma rather than the ileum. She is not on crohns therapy at this time. She verbalizes understanding and will keep appointment for tomorrow.

## 2018-09-10 ENCOUNTER — Encounter: Payer: Self-pay | Admitting: Internal Medicine

## 2018-09-10 ENCOUNTER — Other Ambulatory Visit: Payer: Self-pay

## 2018-09-10 ENCOUNTER — Ambulatory Visit (AMBULATORY_SURGERY_CENTER): Payer: BLUE CROSS/BLUE SHIELD | Admitting: Internal Medicine

## 2018-09-10 VITALS — BP 121/56 | HR 56 | Temp 98.2°F | Resp 8 | Ht 63.0 in | Wt 145.0 lb

## 2018-09-10 DIAGNOSIS — K50813 Crohn's disease of both small and large intestine with fistula: Secondary | ICD-10-CM

## 2018-09-10 DIAGNOSIS — Z932 Ileostomy status: Secondary | ICD-10-CM

## 2018-09-10 DIAGNOSIS — K529 Noninfective gastroenteritis and colitis, unspecified: Secondary | ICD-10-CM | POA: Diagnosis not present

## 2018-09-10 MED ORDER — SODIUM CHLORIDE 0.9 % IV SOLN: 500.0000 mL | Freq: Once | INTRAVENOUS | Status: DC

## 2018-09-10 NOTE — Progress Notes (Signed)
To PACU, VSS. Report to [email or web address] 

## 2018-09-10 NOTE — Progress Notes (Signed)
Called to room to assist during endoscopic procedure.  Patient ID and intended procedure confirmed with present staff. Received instructions for my participation in the procedure from the performing physician.  

## 2018-09-10 NOTE — Op Note (Signed)
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                            performed at [facility] in November 2017; recent small                            bowel obstruction secondary to a parastomal hernia                            in February 2020 with plans for laparoscopic                            parastomal hernia repair with Lauren Figueroa; assessment                            to exclude active Crohn's disease in the distal                            ileum Medicines:                Monitored Anesthesia Care Procedure:                Pre-Anesthesia Assessment:                           - Prior to the procedure, a History and Physical                            was performed, and patient medications and                            allergies were reviewed. The patient's tolerance of                            previous anesthesia was also reviewed. The risks                            and benefits of the procedure and the sedation                            options and risks were discussed with the patient.  All questions were answered, and informed consent                            was obtained. Prior Anticoagulants: The patient has                            taken no previous anticoagulant or antiplatelet                            agents. ASA Grade Assessment: II - A patient with                            mild systemic disease. After reviewing the risks                            and benefits, the patient was deemed in        satisfactory condition to undergo the procedure.                           After I obtained informed consent, the scope was                            passed under direct vision. Throughout the                            procedure, the patient's blood pressure, pulse, and                            oxygen saturation was measured continuously. The                            ileoscopy was performed without difficulty. The                            Endoscope was introduced through the ileostomy and                            advanced to the distal ileum. After I obtained                            informed consent, the scope was passed under direct                            vision. Throughout the procedure, the patient's                            blood pressure, pulse, and oxygen saturation was                            measured continuously. The ileoscopy was performed                            without difficulty. The patient tolerated the  procedure well. The quality of the bowel                            preparation was excellent. Scope In: 12:32:05 PM Scope Out: 12:39:42 PM Total Procedure Duration: 0 hours 7 minutes 37 seconds  Findings:                 The distal ileum contained a single (solitary) 3 mm                            ulcer and erosion. This is located about 15 cm from                            the stomal orifice. No bleeding was present.                            Biopsies were taken with a cold forceps for                            histology. This is a single very small focus of                            potential active inflammation. The immediately                            surrounding mucosa appears normal.                           Patient is status-post subtotal colon resection                            with end ileostomy. Parastomal hernia. The                            ileostomy orifice is rather narrow, but the adult                             upper endoscopy was able to traverse and enter the                            distal ileum. There is sharp angulation a few                            centimeters past the stomal orifice and then normal                            appearing lumen.                           The remainder of the examined ileum, 5 to 10 cm                            proximal to and the entire remaining examined ileal  mucosa distal to the above small ulceration was                            normal. Additional biopsies were obtained from                            normal appearing mucosa distally with cold forceps. Complications:            No immediate complications. Estimated Blood Loss:     Estimated blood loss was minimal. Impression:               - Ileostomy in place.                           - Very small area with small, shallow, erosion and                            ulcer in the distal ileum (at 15 cm). Biopsied.                           - Remaining ileal mucosa proximally and distally                            appears normal. Recommendation:           - Patient has a contact number available for                            emergencies. The signs and symptoms of potential                            delayed complications were discussed with the                            patient. Return to normal activities tomorrow.                            Written discharge instructions were provided to the                            patient.                           - Resume previous diet.                           - Continue present medications.                           - Await pathology results.                           - Follow-up with Lauren Figueroa. Jerene Bears, MD 09/10/2018 1:02:34 PM This report has been signed electronically.

## 2018-09-10 NOTE — Patient Instructions (Signed)
Await pathology results.  Follow up with Dr. Johney Maine.  YOU HAD AN ENDOSCOPIC PROCEDURE TODAY AT [facility]:   Refer to the procedure report that was given to you for any specific questions about what was found during the examination.  If the procedure report does not answer your questions, please call your gastroenterologist to clarify.  If you requested that your care partner not be given the details of your procedure findings, then the procedure report has been included in a sealed envelope for you to review at your convenience later.  YOU SHOULD EXPECT: Some feelings of bloating in the abdomen. Passage of more gas than usual.  Walking can help get rid of the air that was put into your GI tract during the procedure and reduce the bloating. If you had a lower endoscopy (such as a colonoscopy or flexible sigmoidoscopy) you may notice spotting of blood in your stool or on the toilet paper. If you underwent a bowel prep for your procedure, you may not have a normal bowel movement for a few days.  Please Note:  You might notice some irritation and congestion in your nose or some drainage.  This is from the oxygen used during your procedure.  There is no need for concern and it should clear up in a day or so.  SYMPTOMS TO REPORT IMMEDIATELY:   Following lower endoscopy (colonoscopy or flexible sigmoidoscopy):  Excessive amounts of blood in the stool  Significant tenderness or worsening of abdominal pains  Swelling of the abdomen that is new, acute  Fever of 100F or higher   For urgent or emergent issues, a gastroenterologist can be reached at any hour by calling [phone].   DIET:  We do recommend a small meal at first, but then you may proceed to your regular diet.  Drink plenty of fluids but you should avoid alcoholic beverages for 24 hours.  ACTIVITY:  You should plan to take it easy for the rest of today and you should NOT DRIVE or use heavy machinery until tomorrow  (because of the sedation medicines used during the test).    FOLLOW UP: Our staff will call the number listed on your records the next business day following your procedure to check on you and address any questions or concerns that you may have regarding the information given to you following your procedure. If we do not reach you, we will leave a message.  However, if you are feeling well and you are not experiencing any problems, there is no need to return our call.  We will assume that you have returned to your regular daily activities without incident.  If any biopsies were taken you will be contacted by phone or by letter within the next 1-3 weeks.  Please call us at [phone] if you have not heard about the biopsies in 3 weeks.    SIGNATURES/CONFIDENTIALITY: You and/or your care partner have signed paperwork which will be entered into your electronic medical record.  These signatures attest to the fact that that the information above on your After Visit Summary has been reviewed and is understood.  Full responsibility of the confidentiality of this discharge information lies with you and/or your care-partner.

## 2018-09-11 ENCOUNTER — Telehealth: Payer: Self-pay | Admitting: *Deleted

## 2018-09-11 NOTE — Telephone Encounter (Signed)
  Follow up Call-  Call back number 09/10/2018 12/30/2015  Post procedure Call Back phone  # [phone]  Permission to leave phone message Yes Yes  Some recent data might be hidden     Patient questions:  Do you have a fever, pain , or abdominal swelling? no Pain Score  0  Have you tolerated food without any problems? yes  Have you been able to return to your normal activities? yes  Do you have any questions about your discharge instructions: Diet   no Medications  no Follow up visit  no  Do you have questions or concerns about your Care? no  Actions: * If pain score is 4 or above: No action needed

## 2018-09-23 ENCOUNTER — Telehealth: Payer: Self-pay

## 2018-09-23 ENCOUNTER — Ambulatory Visit: Payer: Self-pay | Admitting: Surgery

## 2018-09-23 NOTE — Telephone Encounter (Signed)
1. Have you developed a fever since your procedure? No.  2.   Have you had an respiratory symptoms (SOB or cough) since your procedure? No.  3.   Have you tested positive for COVID 19 since your procedure No.  3.   Have you had any family members/close contacts diagnosed with the COVID 19 since your procedure?  No   If any of these questions are a yes, please inquire if patient has been seen by family doctor and route this note to Joylene John, Therapist, sports.

## 2018-11-13 ENCOUNTER — Encounter (HOSPITAL_COMMUNITY): Payer: Self-pay

## 2018-11-13 ENCOUNTER — Encounter (HOSPITAL_COMMUNITY)
Admission: RE | Admit: 2018-11-13 | Discharge: 2018-11-13 | Disposition: A | Discharge status: Home / Self Care | Payer: BC Managed Care – PPO | Source: Ambulatory Visit | Attending: Surgery | Admitting: Surgery

## 2018-11-13 ENCOUNTER — Other Ambulatory Visit: Payer: Self-pay

## 2018-11-13 DIAGNOSIS — K4090 Unilateral inguinal hernia, without obstruction or gangrene, not specified as recurrent: Secondary | ICD-10-CM | POA: Diagnosis not present

## 2018-11-13 DIAGNOSIS — K435 Parastomal hernia without obstruction or  gangrene: Secondary | ICD-10-CM | POA: Insufficient documentation

## 2018-11-13 DIAGNOSIS — K432 Incisional hernia without obstruction or gangrene: Secondary | ICD-10-CM | POA: Diagnosis not present

## 2018-11-13 DIAGNOSIS — K469 Unspecified abdominal hernia without obstruction or gangrene: Secondary | ICD-10-CM | POA: Diagnosis not present

## 2018-11-13 DIAGNOSIS — Z01812 Encounter for preprocedural laboratory examination: Secondary | ICD-10-CM | POA: Diagnosis present

## 2018-11-13 LAB — CBC
HCT: 33 % — ABNORMAL LOW (ref 36.0–46.0)
Hemoglobin: 11 g/dL — ABNORMAL LOW (ref 12.0–15.0)
MCH: 30.6 pg (ref 26.0–34.0)
MCHC: 33.3 g/dL (ref 30.0–36.0)
MCV: 91.9 fL (ref 80.0–100.0)
Platelets: 237 10*3/uL (ref 150–400)
RBC: 3.59 MIL/uL — ABNORMAL LOW (ref 3.87–5.11)
RDW: 13.5 % (ref 11.5–15.5)
WBC: 5.2 10*3/uL (ref 4.0–10.5)
nRBC: 0 % (ref 0.0–0.2)

## 2018-11-13 NOTE — Patient Instructions (Addendum)
YOU NEED TO HAVE A COVID 19 TEST ON 11-15-18 @ 12:45 AM, THIS TEST MUST BE DONE BEFORE SURGERY, COME TO [place] ENTRANCE. ONCE YOUR COVID TEST IS COMPLETED, PLEASE BEGIN THE QUARANTINE INSTRUCTIONS AS OUTLINED IN YOUR HANDOUT.                Lauren Figueroa  11/13/2018   Your procedure is scheduled on: 11-20-18     Report to [facility] Main  Entrance    Report to Admitting at 6:30 AM    Call this number if you have problems the morning of surgery [phone]    Remember: Do not eat food or drink liquids :After Midnight.     Take these medicines the morning of surgery with A SIP OF WATER: None              BRUSH YOUR TEETH MORNING OF SURGERY AND RINSE YOUR MOUTH OUT, NO CHEWING GUM CANDY OR MINTS.                     You may not have any metal on your body including hair pins and              piercings     Do not wear jewelry, make-up, lotions, powders or perfumes, deodorant               Do not wear nail polish.  Do not shave  48 hours prior to surgery.  .   Do not bring valuables to the hospital. [place].  Contacts, dentures or bridgework may not be worn into surgery.              Please read over the following fact sheets you were given: _____________________________________________________________________             [facility] - Preparing for Surgery Before surgery, you can play an important role.  Because skin is not sterile, your skin needs to be as free of germs as possible.  You can reduce the number of germs on your skin by washing with CHG (chlorahexidine gluconate) soap before surgery.  CHG is an antiseptic cleaner which kills germs and bonds with the skin to continue killing germs even after washing. Please DO NOT use if you have an allergy to CHG or antibacterial soaps.  If your skin becomes reddened/irritated stop using the CHG and inform your nurse when you arrive at  Short Stay. Do not shave (including legs and underarms) for at least 48 hours prior to the first CHG shower.  You may shave your face/neck. Please follow these instructions carefully:  1.  Shower with CHG Soap the night before surgery and the  morning of Surgery.  2.  If you choose to wash your hair, wash your hair first as usual with your  normal  shampoo.  3.  After you shampoo, rinse your hair and body thoroughly to remove the  shampoo.                           4.  Use CHG as you would any other liquid soap.  You can apply chg directly  to the skin and wash  Gently with a scrungie or clean washcloth.  5.  Apply the CHG Soap to your body ONLY FROM THE NECK DOWN.   Do not use on face/ open                           Wound or open sores. Avoid contact with eyes, ears mouth and genitals (private parts).                       Wash face,  Genitals (private parts) with your normal soap.             6.  Wash thoroughly, paying special attention to the area where your surgery  will be performed.  7.  Thoroughly rinse your body with warm water from the neck down.  8.  DO NOT shower/wash with your normal soap after using and rinsing off  the CHG Soap.                9.  Pat yourself dry with a clean towel.            10.  Wear clean pajamas.            11.  Place clean sheets on your bed the night of your first shower and do not  sleep with pets. Day of Surgery : Do not apply any lotions/deodorants the morning of surgery.  Please wear clean clothes to the hospital/surgery center.  FAILURE TO FOLLOW THESE INSTRUCTIONS MAY RESULT IN THE CANCELLATION OF YOUR SURGERY PATIENT SIGNATURE_________________________________  NURSE SIGNATURE__________________________________  ________________________________________________________________________

## 2018-11-15 ENCOUNTER — Other Ambulatory Visit (HOSPITAL_COMMUNITY)
Admission: RE | Admit: 2018-11-15 | Discharge: 2018-11-15 | Disposition: A | Discharge status: Home / Self Care | Payer: BC Managed Care – PPO | Source: Ambulatory Visit | Attending: Surgery | Admitting: Surgery

## 2018-11-15 DIAGNOSIS — Z01812 Encounter for preprocedural laboratory examination: Secondary | ICD-10-CM | POA: Diagnosis present

## 2018-11-15 DIAGNOSIS — Z1159 Encounter for screening for other viral diseases: Secondary | ICD-10-CM | POA: Diagnosis not present

## 2018-11-15 LAB — SARS CORONAVIRUS 2 (TAT 6-24 HRS): SARS Coronavirus 2: NEGATIVE

## 2018-11-19 MED ORDER — CLINDAMYCIN PHOSPHATE 900 MG/50ML IV SOLN
900.0000 mg | INTRAVENOUS | Status: AC
  Administered 2018-11-20: 900 mg via INTRAVENOUS
  Filled 2018-11-19: qty 50

## 2018-11-19 MED ORDER — BUPIVACAINE LIPOSOME 1.3 % IJ SUSP
20.0000 mL | Freq: Once | INTRAMUSCULAR | Status: DC
  Filled 2018-11-19: qty 20

## 2018-11-19 MED ORDER — DEXTROSE 5 % IV SOLN
5.0000 mg/kg | INTRAVENOUS | Status: AC
Start: 2018-11-20 — End: 2018-11-20
  Administered 2018-11-20: 330 mg via INTRAVENOUS
  Filled 2018-11-19: qty 8.25

## 2018-11-20 ENCOUNTER — Encounter (HOSPITAL_COMMUNITY): Payer: Self-pay | Admitting: *Deleted

## 2018-11-20 ENCOUNTER — Ambulatory Visit (HOSPITAL_COMMUNITY): Payer: BC Managed Care – PPO | Admitting: Certified Registered"

## 2018-11-20 ENCOUNTER — Other Ambulatory Visit: Payer: Self-pay

## 2018-11-20 ENCOUNTER — Observation Stay (HOSPITAL_COMMUNITY)
Admission: RE | Admit: 2018-11-20 | Discharge: 2018-11-22 | Disposition: A | Discharge status: Home / Self Care | Payer: BC Managed Care – PPO | Attending: Surgery | Admitting: Surgery

## 2018-11-20 ENCOUNTER — Encounter (HOSPITAL_COMMUNITY)
Admission: RE | Disposition: A | Discharge status: Home / Self Care | Payer: Self-pay | Source: Home / Self Care | Attending: Surgery

## 2018-11-20 ENCOUNTER — Ambulatory Visit (HOSPITAL_COMMUNITY): Payer: BC Managed Care – PPO | Admitting: Physician Assistant

## 2018-11-20 DIAGNOSIS — Z8379 Family history of other diseases of the digestive system: Secondary | ICD-10-CM | POA: Insufficient documentation

## 2018-11-20 DIAGNOSIS — K4190 Unilateral femoral hernia, without obstruction or gangrene, not specified as recurrent: Secondary | ICD-10-CM

## 2018-11-20 DIAGNOSIS — M19042 Primary osteoarthritis, left hand: Secondary | ICD-10-CM | POA: Diagnosis not present

## 2018-11-20 DIAGNOSIS — D649 Anemia, unspecified: Secondary | ICD-10-CM | POA: Diagnosis not present

## 2018-11-20 DIAGNOSIS — K56699 Other intestinal obstruction unspecified as to partial versus complete obstruction: Secondary | ICD-10-CM | POA: Insufficient documentation

## 2018-11-20 DIAGNOSIS — K5010 Crohn's disease of large intestine without complications: Secondary | ICD-10-CM | POA: Insufficient documentation

## 2018-11-20 DIAGNOSIS — Z809 Family history of malignant neoplasm, unspecified: Secondary | ICD-10-CM | POA: Diagnosis not present

## 2018-11-20 DIAGNOSIS — K432 Incisional hernia without obstruction or gangrene: Secondary | ICD-10-CM | POA: Diagnosis not present

## 2018-11-20 DIAGNOSIS — Z87891 Personal history of nicotine dependence: Secondary | ICD-10-CM | POA: Diagnosis not present

## 2018-11-20 DIAGNOSIS — Z8249 Family history of ischemic heart disease and other diseases of the circulatory system: Secondary | ICD-10-CM | POA: Diagnosis not present

## 2018-11-20 DIAGNOSIS — Z9049 Acquired absence of other specified parts of digestive tract: Secondary | ICD-10-CM | POA: Diagnosis not present

## 2018-11-20 DIAGNOSIS — M4690 Unspecified inflammatory spondylopathy, site unspecified: Secondary | ICD-10-CM | POA: Insufficient documentation

## 2018-11-20 DIAGNOSIS — Z7901 Long term (current) use of anticoagulants: Secondary | ICD-10-CM | POA: Insufficient documentation

## 2018-11-20 DIAGNOSIS — K433 Parastomal hernia with obstruction, without gangrene: Secondary | ICD-10-CM | POA: Insufficient documentation

## 2018-11-20 DIAGNOSIS — Z882 Allergy status to sulfonamides status: Secondary | ICD-10-CM | POA: Diagnosis not present

## 2018-11-20 DIAGNOSIS — M810 Age-related osteoporosis without current pathological fracture: Secondary | ICD-10-CM | POA: Insufficient documentation

## 2018-11-20 DIAGNOSIS — K4030 Unilateral inguinal hernia, with obstruction, without gangrene, not specified as recurrent: Secondary | ICD-10-CM | POA: Diagnosis not present

## 2018-11-20 DIAGNOSIS — Z820 Family history of epilepsy and other diseases of the nervous system: Secondary | ICD-10-CM | POA: Insufficient documentation

## 2018-11-20 DIAGNOSIS — K4090 Unilateral inguinal hernia, without obstruction or gangrene, not specified as recurrent: Secondary | ICD-10-CM | POA: Diagnosis present

## 2018-11-20 DIAGNOSIS — Z79899 Other long term (current) drug therapy: Secondary | ICD-10-CM | POA: Diagnosis not present

## 2018-11-20 DIAGNOSIS — Z833 Family history of diabetes mellitus: Secondary | ICD-10-CM | POA: Diagnosis not present

## 2018-11-20 DIAGNOSIS — K50113 Crohn's disease of large intestine with fistula: Secondary | ICD-10-CM | POA: Diagnosis present

## 2018-11-20 DIAGNOSIS — K660 Peritoneal adhesions (postprocedural) (postinfection): Secondary | ICD-10-CM | POA: Diagnosis not present

## 2018-11-20 DIAGNOSIS — M19041 Primary osteoarthritis, right hand: Secondary | ICD-10-CM | POA: Diagnosis not present

## 2018-11-20 DIAGNOSIS — Z801 Family history of malignant neoplasm of trachea, bronchus and lung: Secondary | ICD-10-CM | POA: Insufficient documentation

## 2018-11-20 DIAGNOSIS — Z886 Allergy status to analgesic agent status: Secondary | ICD-10-CM | POA: Diagnosis not present

## 2018-11-20 DIAGNOSIS — Z881 Allergy status to other antibiotic agents status: Secondary | ICD-10-CM | POA: Insufficient documentation

## 2018-11-20 DIAGNOSIS — K5090 Crohn's disease, unspecified, without complications: Secondary | ICD-10-CM | POA: Diagnosis present

## 2018-11-20 DIAGNOSIS — Z836 Family history of other diseases of the respiratory system: Secondary | ICD-10-CM | POA: Insufficient documentation

## 2018-11-20 DIAGNOSIS — K4130 Unilateral femoral hernia, with obstruction, without gangrene, not specified as recurrent: Secondary | ICD-10-CM | POA: Insufficient documentation

## 2018-11-20 DIAGNOSIS — Z818 Family history of other mental and behavioral disorders: Secondary | ICD-10-CM | POA: Insufficient documentation

## 2018-11-20 DIAGNOSIS — M1612 Unilateral primary osteoarthritis, left hip: Secondary | ICD-10-CM | POA: Diagnosis not present

## 2018-11-20 DIAGNOSIS — K435 Parastomal hernia without obstruction or  gangrene: Secondary | ICD-10-CM | POA: Diagnosis present

## 2018-11-20 HISTORY — PX: LAPAROSCOPIC LYSIS OF ADHESIONS: SHX5905

## 2018-11-20 HISTORY — PX: LAPAROSCOPIC PARASTOMAL HERNIA: SHX6347

## 2018-11-20 SURGERY — REPAIR, HERNIA, PARASTOMAL, LAPAROSCOPIC
Anesthesia: General

## 2018-11-20 MED ORDER — SODIUM CHLORIDE 0.9 % IV SOLN: 250.0000 mL | INTRAVENOUS | Status: DC | PRN

## 2018-11-20 MED ORDER — VITAMIN D 25 MCG (1000 UNIT) PO TABS
2000.0000 [IU] | ORAL_TABLET | Freq: Every day | ORAL | Status: DC
  Administered 2018-11-21 – 2018-11-22 (×2): 2000 [IU] via ORAL
  Filled 2018-11-20 (×2): qty 2

## 2018-11-20 MED ORDER — HYDRALAZINE HCL 20 MG/ML IJ SOLN: 5.0000 mg | INTRAMUSCULAR | Status: DC | PRN

## 2018-11-20 MED ORDER — EPHEDRINE SULFATE-NACL 50-0.9 MG/10ML-% IV SOSY
PREFILLED_SYRINGE | INTRAVENOUS | Status: DC | PRN
  Administered 2018-11-20: 10 mg via INTRAVENOUS
  Administered 2018-11-20 (×2): 5 mg via INTRAVENOUS
  Administered 2018-11-20: 10 mg via INTRAVENOUS
  Administered 2018-11-20 (×2): 5 mg via INTRAVENOUS

## 2018-11-20 MED ORDER — PROPOFOL 10 MG/ML IV BOLUS
INTRAVENOUS | Status: DC | PRN
  Administered 2018-11-20: 140 mg via INTRAVENOUS

## 2018-11-20 MED ORDER — PROPOFOL 10 MG/ML IV BOLUS
INTRAVENOUS | Status: AC
  Filled 2018-11-20: qty 20

## 2018-11-20 MED ORDER — METOPROLOL TARTRATE 5 MG/5ML IV SOLN: 5.0000 mg | Freq: Four times a day (QID) | INTRAVENOUS | Status: DC | PRN

## 2018-11-20 MED ORDER — BUPIVACAINE LIPOSOME 1.3 % IJ SUSP
INTRAMUSCULAR | Status: DC | PRN
  Administered 2018-11-20: 20 mL

## 2018-11-20 MED ORDER — CLINDAMYCIN PHOSPHATE 600 MG/50ML IV SOLN
600.0000 mg | Freq: Four times a day (QID) | INTRAVENOUS | Status: AC
  Administered 2018-11-20 – 2018-11-21 (×3): 600 mg via INTRAVENOUS
  Filled 2018-11-20 (×3): qty 50

## 2018-11-20 MED ORDER — MIDAZOLAM HCL 2 MG/2ML IJ SOLN
INTRAMUSCULAR | Status: DC | PRN
  Administered 2018-11-20: 2 mg via INTRAVENOUS

## 2018-11-20 MED ORDER — ADULT MULTIVITAMIN W/MINERALS CH
1.0000 | ORAL_TABLET | Freq: Every day | ORAL | Status: DC
  Administered 2018-11-21 – 2018-11-22 (×2): 1 via ORAL
  Filled 2018-11-20 (×2): qty 1

## 2018-11-20 MED ORDER — ZOLPIDEM TARTRATE 5 MG PO TABS: 5.0000 mg | ORAL_TABLET | Freq: Every evening | ORAL | Status: DC | PRN

## 2018-11-20 MED ORDER — ACETAMINOPHEN 500 MG PO TABS
1000.0000 mg | ORAL_TABLET | Freq: Three times a day (TID) | ORAL | Status: DC
  Administered 2018-11-20 – 2018-11-22 (×5): 1000 mg via ORAL
  Filled 2018-11-20 (×5): qty 2

## 2018-11-20 MED ORDER — GABAPENTIN 300 MG PO CAPS
300.0000 mg | ORAL_CAPSULE | ORAL | Status: AC
Start: 2018-11-20 — End: 2018-11-20
  Administered 2018-11-20: 300 mg via ORAL
  Filled 2018-11-20: qty 1

## 2018-11-20 MED ORDER — HYDROMORPHONE HCL 1 MG/ML IJ SOLN: 0.2500 mg | INTRAMUSCULAR | Status: DC | PRN

## 2018-11-20 MED ORDER — MEPERIDINE HCL 50 MG/ML IJ SOLN: 6.2500 mg | INTRAMUSCULAR | Status: DC | PRN

## 2018-11-20 MED ORDER — BISACODYL 10 MG RE SUPP: 10.0000 mg | Freq: Every day | RECTAL | Status: DC | PRN

## 2018-11-20 MED ORDER — MENTHOL 3 MG MT LOZG: 1.0000 | LOZENGE | OROMUCOSAL | Status: DC | PRN

## 2018-11-20 MED ORDER — KETAMINE HCL 10 MG/ML IJ SOLN
INTRAMUSCULAR | Status: DC | PRN
  Administered 2018-11-20: 15 mg via INTRAVENOUS
  Administered 2018-11-20: 10 mg via INTRAVENOUS

## 2018-11-20 MED ORDER — GUAIFENESIN-DM 100-10 MG/5ML PO SYRP: 10.0000 mL | ORAL_SOLUTION | ORAL | Status: DC | PRN

## 2018-11-20 MED ORDER — MAGIC MOUTHWASH
15.0000 mL | Freq: Four times a day (QID) | ORAL | Status: DC | PRN
  Filled 2018-11-20: qty 15

## 2018-11-20 MED ORDER — MIDAZOLAM HCL 2 MG/2ML IJ SOLN
INTRAMUSCULAR | Status: AC
  Filled 2018-11-20: qty 2

## 2018-11-20 MED ORDER — DEXAMETHASONE SODIUM PHOSPHATE 10 MG/ML IJ SOLN
INTRAMUSCULAR | Status: DC | PRN
  Administered 2018-11-20: 8 mg via INTRAVENOUS

## 2018-11-20 MED ORDER — ROCURONIUM BROMIDE 10 MG/ML (PF) SYRINGE
PREFILLED_SYRINGE | INTRAVENOUS | Status: DC | PRN
  Administered 2018-11-20: 10 mg via INTRAVENOUS
  Administered 2018-11-20: 50 mg via INTRAVENOUS
  Administered 2018-11-20 (×4): 10 mg via INTRAVENOUS

## 2018-11-20 MED ORDER — SODIUM CHLORIDE 0.9% FLUSH: 3.0000 mL | INTRAVENOUS | Status: DC | PRN

## 2018-11-20 MED ORDER — PHENOL 1.4 % MT LIQD: 1.0000 | OROMUCOSAL | Status: DC | PRN

## 2018-11-20 MED ORDER — LIDOCAINE HCL 2 % IJ SOLN
INTRAMUSCULAR | Status: AC
  Filled 2018-11-20: qty 20

## 2018-11-20 MED ORDER — LACTATED RINGERS IV SOLN: 1000.0000 mL | Freq: Three times a day (TID) | INTRAVENOUS | Status: DC | PRN

## 2018-11-20 MED ORDER — ENOXAPARIN SODIUM 40 MG/0.4ML ~~LOC~~ SOLN
40.0000 mg | SUBCUTANEOUS | Status: DC
  Administered 2018-11-21 – 2018-11-22 (×2): 40 mg via SUBCUTANEOUS
  Filled 2018-11-20 (×2): qty 0.4

## 2018-11-20 MED ORDER — LIDOCAINE 2% (20 MG/ML) 5 ML SYRINGE
INTRAMUSCULAR | Status: AC
  Filled 2018-11-20: qty 5

## 2018-11-20 MED ORDER — LACTATED RINGERS IV BOLUS: 1000.0000 mL | Freq: Three times a day (TID) | INTRAVENOUS | Status: DC | PRN

## 2018-11-20 MED ORDER — ONDANSETRON HCL 4 MG/2ML IJ SOLN
INTRAMUSCULAR | Status: AC
  Filled 2018-11-20: qty 2

## 2018-11-20 MED ORDER — ONDANSETRON 4 MG PO TBDP: 4.0000 mg | ORAL_TABLET | Freq: Four times a day (QID) | ORAL | Status: DC | PRN

## 2018-11-20 MED ORDER — SUCCINYLCHOLINE CHLORIDE 200 MG/10ML IV SOSY
PREFILLED_SYRINGE | INTRAVENOUS | Status: AC
  Filled 2018-11-20: qty 10

## 2018-11-20 MED ORDER — PROMETHAZINE HCL 25 MG/ML IJ SOLN: 6.2500 mg | INTRAMUSCULAR | Status: DC | PRN

## 2018-11-20 MED ORDER — SODIUM CHLORIDE 0.9% FLUSH: 3.0000 mL | Freq: Two times a day (BID) | INTRAVENOUS | Status: DC

## 2018-11-20 MED ORDER — LIDOCAINE 2% (20 MG/ML) 5 ML SYRINGE
INTRAMUSCULAR | Status: DC | PRN
  Administered 2018-11-20: 40 mg via INTRAVENOUS

## 2018-11-20 MED ORDER — BUPIVACAINE-EPINEPHRINE (PF) 0.25% -1:200000 IJ SOLN
INTRAMUSCULAR | Status: AC
  Filled 2018-11-20: qty 60

## 2018-11-20 MED ORDER — PROCHLORPERAZINE EDISYLATE 10 MG/2ML IJ SOLN: 5.0000 mg | Freq: Four times a day (QID) | INTRAMUSCULAR | Status: DC | PRN

## 2018-11-20 MED ORDER — ACETAMINOPHEN 10 MG/ML IV SOLN: 1000.0000 mg | Freq: Once | INTRAVENOUS | Status: DC | PRN

## 2018-11-20 MED ORDER — FENTANYL CITRATE (PF) 100 MCG/2ML IJ SOLN
INTRAMUSCULAR | Status: DC | PRN
  Administered 2018-11-20 (×2): 50 ug via INTRAVENOUS

## 2018-11-20 MED ORDER — DIPHENHYDRAMINE HCL 12.5 MG/5ML PO ELIX: 12.5000 mg | ORAL_SOLUTION | Freq: Four times a day (QID) | ORAL | Status: DC | PRN

## 2018-11-20 MED ORDER — GABAPENTIN 300 MG PO CAPS
300.0000 mg | ORAL_CAPSULE | Freq: Two times a day (BID) | ORAL | Status: DC
  Administered 2018-11-20 – 2018-11-22 (×4): 300 mg via ORAL
  Filled 2018-11-20 (×4): qty 1

## 2018-11-20 MED ORDER — METHOCARBAMOL 1000 MG/10ML IJ SOLN
1000.0000 mg | Freq: Four times a day (QID) | INTRAVENOUS | Status: DC | PRN
  Filled 2018-11-20: qty 10

## 2018-11-20 MED ORDER — POLYETHYLENE GLYCOL 3350 17 G PO PACK
17.0000 g | PACK | Freq: Two times a day (BID) | ORAL | Status: DC
  Administered 2018-11-20 – 2018-11-22 (×4): 17 g via ORAL
  Filled 2018-11-20 (×4): qty 1

## 2018-11-20 MED ORDER — OXYCODONE HCL 5 MG PO TABS
5.0000 mg | ORAL_TABLET | ORAL | Status: DC | PRN
  Administered 2018-11-21 – 2018-11-22 (×2): 5 mg via ORAL
  Filled 2018-11-20 (×2): qty 1

## 2018-11-20 MED ORDER — METHOCARBAMOL 500 MG PO TABS: 1000.0000 mg | ORAL_TABLET | Freq: Four times a day (QID) | ORAL | Status: DC | PRN

## 2018-11-20 MED ORDER — SIMETHICONE 80 MG PO CHEW: 40.0000 mg | CHEWABLE_TABLET | Freq: Four times a day (QID) | ORAL | Status: DC | PRN

## 2018-11-20 MED ORDER — ALUM & MAG HYDROXIDE-SIMETH 200-200-20 MG/5ML PO SUSP: 30.0000 mL | Freq: Four times a day (QID) | ORAL | Status: DC | PRN

## 2018-11-20 MED ORDER — ONDANSETRON HCL 4 MG/2ML IJ SOLN
INTRAMUSCULAR | Status: DC | PRN
  Administered 2018-11-20 (×2): 4 mg via INTRAVENOUS

## 2018-11-20 MED ORDER — LACTATED RINGERS IV SOLN
INTRAVENOUS | Status: DC
  Administered 2018-11-20 (×2): via INTRAVENOUS

## 2018-11-20 MED ORDER — HYDROCORTISONE 1 % EX CREA: 1.0000 "application " | TOPICAL_CREAM | Freq: Three times a day (TID) | CUTANEOUS | Status: DC | PRN

## 2018-11-20 MED ORDER — STERILE WATER FOR IRRIGATION IR SOLN
Status: DC | PRN
Start: 2018-11-20 — End: 2018-11-20
  Administered 2018-11-20: 1000 mL

## 2018-11-20 MED ORDER — BUPIVACAINE-EPINEPHRINE 0.25% -1:200000 IJ SOLN
INTRAMUSCULAR | Status: DC | PRN
  Administered 2018-11-20: 50 mL

## 2018-11-20 MED ORDER — PROCHLORPERAZINE MALEATE 10 MG PO TABS: 10.0000 mg | ORAL_TABLET | Freq: Four times a day (QID) | ORAL | Status: DC | PRN

## 2018-11-20 MED ORDER — LIP MEDEX EX OINT
1.0000 "application " | TOPICAL_OINTMENT | Freq: Two times a day (BID) | CUTANEOUS | Status: DC
  Administered 2018-11-20 – 2018-11-22 (×4): 1 via TOPICAL
  Filled 2018-11-20 (×2): qty 7

## 2018-11-20 MED ORDER — SUGAMMADEX SODIUM 200 MG/2ML IV SOLN
INTRAVENOUS | Status: DC | PRN
  Administered 2018-11-20: 130 mg via INTRAVENOUS

## 2018-11-20 MED ORDER — ACETAMINOPHEN 500 MG PO TABS
1000.0000 mg | ORAL_TABLET | ORAL | Status: AC
  Administered 2018-11-20: 1000 mg via ORAL
  Filled 2018-11-20: qty 2

## 2018-11-20 MED ORDER — SUCCINYLCHOLINE CHLORIDE 200 MG/10ML IV SOSY
PREFILLED_SYRINGE | INTRAVENOUS | Status: DC | PRN
  Administered 2018-11-20: 100 mg via INTRAVENOUS

## 2018-11-20 MED ORDER — POLYETHYLENE GLYCOL 3350 17 G PO PACK: 17.0000 g | PACK | Freq: Every day | ORAL | Status: DC | PRN

## 2018-11-20 MED ORDER — DIPHENHYDRAMINE HCL 50 MG/ML IJ SOLN: 12.5000 mg | Freq: Four times a day (QID) | INTRAMUSCULAR | Status: DC | PRN

## 2018-11-20 MED ORDER — 0.9 % SODIUM CHLORIDE (POUR BTL) OPTIME
TOPICAL | Status: DC | PRN
  Administered 2018-11-20: 09:00:00 1000 mL

## 2018-11-20 MED ORDER — KETAMINE HCL 10 MG/ML IJ SOLN
INTRAMUSCULAR | Status: AC
  Filled 2018-11-20: qty 1

## 2018-11-20 MED ORDER — HYDROMORPHONE HCL 1 MG/ML IJ SOLN
0.5000 mg | INTRAMUSCULAR | Status: DC | PRN
  Administered 2018-11-21 (×2): 1 mg via INTRAVENOUS
  Filled 2018-11-20 (×2): qty 1

## 2018-11-20 MED ORDER — FENTANYL CITRATE (PF) 100 MCG/2ML IJ SOLN
INTRAMUSCULAR | Status: AC
  Filled 2018-11-20: qty 2

## 2018-11-20 MED ORDER — DEXAMETHASONE SODIUM PHOSPHATE 10 MG/ML IJ SOLN
INTRAMUSCULAR | Status: AC
  Filled 2018-11-20: qty 1

## 2018-11-20 MED ORDER — HYDROCORTISONE (PERIANAL) 2.5 % EX CREA: 1.0000 "application " | TOPICAL_CREAM | Freq: Four times a day (QID) | CUTANEOUS | Status: DC | PRN

## 2018-11-20 MED ORDER — ONDANSETRON HCL 4 MG/2ML IJ SOLN
4.0000 mg | Freq: Four times a day (QID) | INTRAMUSCULAR | Status: DC | PRN
  Administered 2018-11-22: 4 mg via INTRAVENOUS
  Filled 2018-11-20: qty 2

## 2018-11-20 MED ORDER — LIDOCAINE 2% (20 MG/ML) 5 ML SYRINGE
INTRAMUSCULAR | Status: DC | PRN
  Administered 2018-11-20: 1.5 mg/kg/h via INTRAVENOUS

## 2018-11-20 MED ORDER — SODIUM CHLORIDE 0.9 % IV SOLN
INTRAVENOUS | Status: DC
  Administered 2018-11-20: 17:00:00 via INTRAVENOUS

## 2018-11-20 SURGICAL SUPPLY — 50 items
APPLIER CLIP 5 13 M/L LIGAMAX5 (MISCELLANEOUS)
BINDER ABDOMINAL 12 ML 46-62 (SOFTGOODS) ×2 IMPLANT
CABLE HIGH FREQUENCY MONO STRZ (ELECTRODE) ×4 IMPLANT
CHLORAPREP W/TINT 26 (MISCELLANEOUS) ×4 IMPLANT
CLIP APPLIE 5 13 M/L LIGAMAX5 (MISCELLANEOUS) IMPLANT
CLOSURE WOUND 1/2 X4 (GAUZE/BANDAGES/DRESSINGS) ×2
COVER SURGICAL LIGHT HANDLE (MISCELLANEOUS) ×4 IMPLANT
COVER WAND RF STERILE (DRAPES) ×4 IMPLANT
DECANTER SPIKE VIAL GLASS SM (MISCELLANEOUS) ×4 IMPLANT
DEVICE SECURE STRAP 25 ABSORB (INSTRUMENTS) ×4 IMPLANT
DEVICE TROCAR PUNCTURE CLOSURE (ENDOMECHANICALS) ×4 IMPLANT
DRAPE INCISE IOBAN 66X45 STRL (DRAPES) ×2 IMPLANT
DRAPE WARM FLUID 44X44 (DRAPES) ×4 IMPLANT
DRSG TEGADERM 2-3/8X2-3/4 SM (GAUZE/BANDAGES/DRESSINGS) ×12 IMPLANT
DRSG TEGADERM 4X4.75 (GAUZE/BANDAGES/DRESSINGS) ×4 IMPLANT
ELECT REM PT RETURN 15FT ADLT (MISCELLANEOUS) ×4 IMPLANT
GAUZE SPONGE 2X2 8PLY STRL LF (GAUZE/BANDAGES/DRESSINGS) IMPLANT
GLOVE BIOGEL PI IND STRL 7.0 (GLOVE) IMPLANT
GLOVE BIOGEL PI INDICATOR 7.0 (GLOVE) ×6
GLOVE ECLIPSE 6.5 STRL STRAW (GLOVE) ×4 IMPLANT
GLOVE ECLIPSE 8.0 STRL XLNG CF (GLOVE) ×4 IMPLANT
GLOVE INDICATOR 8.0 STRL GRN (GLOVE) ×4 IMPLANT
GOWN STRL REUS W/TWL LRG LVL3 (GOWN DISPOSABLE) ×4 IMPLANT
GOWN STRL REUS W/TWL XL LVL3 (GOWN DISPOSABLE) ×8 IMPLANT
IRRIG SUCT STRYKERFLOW 2 WTIP (MISCELLANEOUS) ×4
IRRIGATION SUCT STRKRFLW 2 WTP (MISCELLANEOUS) IMPLANT
KIT BASIN OR (CUSTOM PROCEDURE TRAY) ×4 IMPLANT
KIT TURNOVER KIT A (KITS) IMPLANT
MARKER SKIN DUAL TIP RULER LAB (MISCELLANEOUS) ×4 IMPLANT
MESH PHASIX ST 15X20 (Mesh General) ×2 IMPLANT
MESH ULTRAPRO 6X6 15CM15CM (Mesh General) ×2 IMPLANT
MESH VENTRALIGHT ST 8X10 (Mesh General) ×2 IMPLANT
NDL SPNL 22GX3.5 QUINCKE BK (NEEDLE) IMPLANT
NEEDLE SPNL 22GX3.5 QUINCKE BK (NEEDLE) ×4 IMPLANT
PAD POSITIONING PINK XL (MISCELLANEOUS) ×4 IMPLANT
SCISSORS LAP 5X35 DISP (ENDOMECHANICALS) ×4 IMPLANT
SET TUBE SMOKE EVAC HIGH FLOW (TUBING) ×4 IMPLANT
SLEEVE ADV FIXATION 5X100MM (TROCAR) ×4 IMPLANT
SPONGE GAUZE 2X2 STER 10/PKG (GAUZE/BANDAGES/DRESSINGS)
STRIP CLOSURE SKIN 1/2X4 (GAUZE/BANDAGES/DRESSINGS) ×6 IMPLANT
SUT MNCRL AB 4-0 PS2 18 (SUTURE) ×4 IMPLANT
SUT PDS AB 1 CT1 27 (SUTURE) ×24 IMPLANT
SUT PROLENE 1 CT 1 30 (SUTURE) ×20 IMPLANT
TOWEL OR 17X26 10 PK STRL BLUE (TOWEL DISPOSABLE) ×4 IMPLANT
TRAY FOLEY MTR SLVR 14FR STAT (SET/KITS/TRAYS/PACK) ×2 IMPLANT
TRAY LAPAROSCOPIC (CUSTOM PROCEDURE TRAY) ×4 IMPLANT
TROCAR ADV FIXATION 11X100MM (TROCAR) IMPLANT
TROCAR ADV FIXATION 5X100MM (TROCAR) ×4 IMPLANT
TROCAR BLADELESS OPT 5 100 (ENDOMECHANICALS) ×4 IMPLANT
TROCAR XCEL NON-BLD 11X100MML (ENDOMECHANICALS) ×2 IMPLANT

## 2018-11-20 NOTE — Anesthesia Procedure Notes (Signed)
Procedure Name: Intubation Date/Time: 11/20/2018 8:26 AM Performed by: Niel Hummer, CRNA Pre-anesthesia Checklist: Patient being monitored, Suction available, Emergency Drugs available and Patient identified Patient Re-evaluated:Patient Re-evaluated prior to induction Oxygen Delivery Method: Circle system utilized Preoxygenation: Pre-oxygenation with 100% oxygen Induction Type: IV induction and Rapid sequence Laryngoscope Size: Mac and 4 Grade View: Grade I Tube type: Oral Tube size: 7.0 mm Number of attempts: 1 Airway Equipment and Method: Stylet Placement Confirmation: ETT inserted through vocal cords under direct vision,  positive ETCO2 and breath sounds checked- equal and bilateral Secured at: 21 cm Tube secured with: Tape Dental Injury: Teeth and Oropharynx as per pre-operative assessment

## 2018-11-20 NOTE — Anesthesia Preprocedure Evaluation (Addendum)
Anesthesia Evaluation  Patient identified by MRN, date of birth, ID band Patient awake    Reviewed: Allergy & Precautions, NPO status , Patient's Chart, lab work & pertinent test results  Airway Mallampati: I       Dental no notable dental hx. (+) Teeth Intact   Pulmonary former smoker,    Pulmonary exam normal breath sounds clear to auscultation       Cardiovascular negative cardio ROS Normal cardiovascular exam Rhythm:Regular Rate:Normal     Neuro/Psych negative neurological ROS  negative psych ROS   GI/Hepatic Neg liver ROS,   Endo/Other  negative endocrine ROS  Renal/GU negative Renal ROS     Musculoskeletal   Abdominal Normal abdominal exam  (+)   Peds  Hematology  (+) Blood dyscrasia, anemia ,   Anesthesia Other Findings   Reproductive/Obstetrics                            Anesthesia Physical Anesthesia Plan  ASA: II  Anesthesia Plan: General   Post-op Pain Management:    Induction:   PONV Risk Score and Plan: 4 or greater and Ondansetron and Dexamethasone  Airway Management Planned: Oral ETT  Additional Equipment:   Intra-op Plan:   Post-operative Plan:   Informed Consent: I have reviewed the patients History and Physical, chart, labs and discussed the procedure including the risks, benefits and alternatives for the proposed anesthesia with the patient or authorized representative who has indicated his/her understanding and acceptance.     Dental advisory given  Plan Discussed with: CRNA  Anesthesia Plan Comments:        Anesthesia Quick Evaluation

## 2018-11-20 NOTE — Progress Notes (Signed)
Patient assisted to ambulate and reports something is dripping on her legs, patient was assisted back to the bed and incisions was assessed , bleeding was noticed on one of the small incision located at right lower quadrant . Pressured dressing was then applied. We will continue to monitor.

## 2018-11-20 NOTE — H&P (Signed)
Lauren Figueroa DOB: [birth year] Married / Language: Cleophus Molt / Race: White Female   Patient Care Team: Pa, [place] as PCP - General (Family Medicine) Kem Parkinson, MD as Consulting Physician (Colon and Rectal Surgery) Pyrtle, Lajuan Lines, MD as Consulting Physician (Gastroenterology) Timoteo Gaul, MD as Consulting Physician (Urology) Michael Boston, MD as Consulting Physician (General Surgery)  Patient sent for surgical consultation at the request of self  Chief Complaint: Parastomal hernia causing bowel obstruction. ` ` The patient returns after reduction of incarcerated parastomal hernia causing bowel obstruction. She comes today with her husband. She is feeling much better. Emptying her bag about 6 times a day. Ileostomy effluent a little more loose and appetite down but getting there. She has not followed up with gastroenterology yet. She is not on any immunosuppression. Denies any problems with urination. No drainage or issues in her pelvis. Never was she goes through pain like that again. She is leaning more towards surgery. She comes in today to discuss with me. Normally walking without difficulty. Her daughter unfortunately has Crohn's it's even worse than hers (which was pretty bad. See below)  (Review of systems as stated in this history (HPI) or in the review of systems. Otherwise all other 12 point ROS are negative) ` ` ` PRIOR CONSULT NOTE: Pleasant woman with a history of Crohn's disease for several decades. Followed by [facility] gastroenterology for most of this time. Initially Dr. Sharlett Iles, and then more recently Dr. Hilarie Fredrickson. Intermittently on Humira. Had seen Dr. Annabell Sabal many decades ago with our group who has since retired and passed away. Had seen Dr. Lucia Gaskins with our group intermittently in the past apparently. She had some mild abdominal pain and crampiness. 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 Sent to [facility]. Dr. Sheryn Bison evaluated and recommended surgical intervention. Lap converted to open resection. 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 Fistula to small intestine. Small intestine to the bladder. Small intestine to colon. Extremely complicated with poor tissue quality and inflammation. Transverse and splenic flexure colon adherent to down there as well. Nonsalvageable. 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 Small bowel resections, partial vaginal wall resection for rectovaginal fistula, bladder repair. November 2017. Gradually recovered. As far as I can gather she has not had any major issues for the past few years. She notes now that she is been having the ileostomy and diverted, she is not struggled with the lower pelvic pain and fistulas and drainage. Last saw [place] gastroenterology last fall.   However she started getting some crampy abdominal pain and decreased daily ostomy output starting yesterday morning. Persisted concerned her. No improvement with Dulcolax oral suppositories. She called the gastroenterology office. They recommended evaluation. Emergency room. Not particularly toxic. CAT scan concerning for bowel obstruction. Surgical consultation requested. Patient denies any sick contacts or travel history. The ileostomy has been working rather well with her. No history of flares or needing any immunosuppression in the past few years. Output can vary depending on diet, but usually she empties the bag 3-4 times a day. Not needing any antidiarrheals. No history of polyps or cancer that she is aware of. No sores or lesions. She has some mild vaginal drainage from time to time but nothing severe nor high-volume. No pneumaturia or recurrent urinary tract infections. Her perineal wound from her  proctectomy has totally closed up with no draining sinus.  Past Surgical History Emeline Gins, [place]; 07/22/2018 4:15 PM) Appendectomy  Colon Removal - Complete   Diagnostic Studies History Emeline Gins, [place]; 07/22/2018 4:15 PM) Colonoscopy  1-5 years ago Mammogram  within last year Pap Smear  >5 years ago  Allergies Emeline Gins, CMA; 07/22/2018 4:16 PM) Flagyl *ANTI-INFECTIVE AGENTS - MISC.*  Allergies Reconciled   Medication History Emeline Gins, CMA; 07/22/2018 4:17 PM) Vitamin D (Cholecalciferol) (1000UNIT Capsule, Oral) Active. Multiple Vitamin (Oral) Active. Medications Reconciled  Social History Emeline Gins, [place]; 07/22/2018 4:15 PM) Alcohol use  Moderate alcohol use. Caffeine use  Coffee. No drug use  Tobacco use  Former smoker.  Family History Emeline Gins, [place]; 07/22/2018 4:15 PM) Arthritis  Daughter. Depression  Mother, Sister. Heart Disease  Brother, Father, Mother. Ischemic Bowel Disease  Daughter. Melanoma  Father. Migraine Headache  Mother, Sister. Respiratory Condition  Father.  Pregnancy / Birth History Emeline Gins, [place]; 07/22/2018 4:15 PM) Age at menarche  13 years. Age of menopause  <44 Contraceptive History  Oral contraceptives. Gravida  2 Length (months) of breastfeeding  7-12 Maternal age  78-25 Para  2  Other Problems Emeline Gins, [place]; 07/22/2018 4:15 PM) Crohn's Disease  Hemorrhoids     Review of Systems Emeline Gins CMA; 07/22/2018 4:15 PM) General Not Present- Appetite Loss, Chills, Fatigue, Fever, Night Sweats, Weight Gain and Weight Loss. Skin Not Present- Change in Wart/Mole, Dryness, Hives, Jaundice, New Lesions, Non-Healing Wounds, Rash and Ulcer. HEENT Present- Wears glasses/contact lenses. Not Present- Earache, Hearing Loss, Hoarseness, Nose Bleed, Oral Ulcers, Ringing in the Ears, Seasonal Allergies, Sinus Pain, Sore Throat, Visual Disturbances and Yellow Eyes. Respiratory Not  Present- Bloody sputum, Chronic Cough, Difficulty Breathing, Snoring and Wheezing. Breast Not Present- Breast Mass, Breast Pain, Nipple Discharge and Skin Changes. Cardiovascular Present- Leg Cramps. Not Present- Chest Pain, Difficulty Breathing Lying Down, Palpitations, Rapid Heart Rate, Shortness of Breath and Swelling of Extremities. Gastrointestinal Present- Abdominal Pain. Not Present- Bloating, Bloody Stool, Change in Bowel Habits, Chronic diarrhea, Constipation, Difficulty Swallowing, Excessive gas, Gets full quickly at meals, Hemorrhoids, Indigestion, Nausea, Rectal Pain and Vomiting. Female Genitourinary Not Present- Frequency, Nocturia, Painful Urination, Pelvic Pain and Urgency. Musculoskeletal Present- Joint Pain. Not Present- Back Pain, Joint Stiffness, Muscle Pain, Muscle Weakness and Swelling of Extremities. Neurological Not Present- Decreased Memory, Fainting, Headaches, Numbness, Seizures, Tingling, Tremor, Trouble walking and Weakness. Psychiatric Not Present- Anxiety, Bipolar, Change in Sleep Pattern, Depression, Fearful and Frequent crying. Hematology Not Present- Blood Thinners, Easy Bruising, Excessive bleeding, Gland problems, HIV and Persistent Infections.  Vitals Emeline Gins CMA; 07/22/2018 4:15 PM) 07/22/2018 4:15 PM Weight: 138.2 lb Height: 63in Body Surface Area: 1.65 m Body Mass Index: 24.48 kg/m  Temp.: 98.41F  Pulse: 72 (Regular)  BP: 138/74(Sitting, Left Arm, Standard)   11/20/2018 BP 137/82   Pulse (!) 54   Temp 97.9 F (36.6 C) (Oral)   Resp 16   Ht 5' 4"  (1.626 m)   Wt 61.7 kg   SpO2 98%   BMI 23.34 kg/m      Physical Exam Adin Hector MD; 07/22/2018 6:12 PM) General Mental Status-Alert. General Appearance-Not in acute distress, Not Sickly. Orientation-Oriented X3. Hydration-Well hydrated. Voice-Normal.  Integumentary Global Assessment Upon inspection and palpation of skin surfaces of the - Axillae: non-tender, no  inflammation or ulceration, no drainage. and Distribution of scalp and body hair is normal. General Characteristics Temperature - normal warmth is noted.  Head and Neck Head-normocephalic, atraumatic with no lesions or palpable masses. Face Global Assessment - atraumatic,  no absence of expression. Neck Global Assessment - no abnormal movements, no bruit auscultated on the right, no bruit auscultated on the left, no decreased range of motion, non-tender. Trachea-midline. Thyroid Gland Characteristics - non-tender.  Eye Eyeball - Left-Extraocular movements intact, No Nystagmus. Eyeball - Right-Extraocular movements intact, No Nystagmus. Cornea - Left-No Hazy. Cornea - Right-No Hazy. Sclera/Conjunctiva - Left-No scleral icterus, No Discharge. Sclera/Conjunctiva - Right-No scleral icterus, No Discharge. Pupil - Left-Direct reaction to light normal. Pupil - Right-Direct reaction to light normal.  ENMT Ears Pinna - Left - no drainage observed, no generalized tenderness observed. Right - no drainage observed, no generalized tenderness observed. Nose and Sinuses External Inspection of the Nose - no destructive lesion observed. Inspection of the nares - Left - quiet respiration. Right - quiet respiration. Mouth and Throat Lips - Upper Lip - no fissures observed, no pallor noted. Lower Lip - no fissures observed, no pallor noted. Nasopharynx - no discharge present. Oral Cavity/Oropharynx - Tongue - no dryness observed. Oral Mucosa - no cyanosis observed. Hypopharynx - no evidence of airway distress observed.  Chest and Lung Exam Inspection Movements - Normal and Symmetrical. Accessory muscles - No use of accessory muscles in breathing. Palpation Palpation of the chest reveals - Non-tender. Auscultation Breath sounds - Normal and Clear.  Cardiovascular Auscultation Rhythm - Regular. Murmurs & Other Heart Sounds - Auscultation of the heart reveals - No Murmurs and No  Systolic Clicks.  Abdomen Inspection Inspection of the abdomen reveals - No Visible peristalsis and No Abnormal pulsations. Umbilicus - No Bleeding, No Urine drainage. Palpation/Percussion Palpation and Percussion of the abdomen reveal - Soft, Non Tender, No Rebound tenderness, No Rigidity (guarding) and No Cutaneous hyperesthesia. Note: Abdomen overweight but soft. Ileostomy in right side. Obvious parastomal hernia. Reducible. Mildly sensitive. Pouch intact without any leak.   Female Genitourinary Sexual Maturity Tanner 5 - Adult hair pattern. Note: No vaginal bleeding nor discharge   Peripheral Vascular Upper Extremity Inspection - Left - No Cyanotic nailbeds, Not Ischemic. Right - No Cyanotic nailbeds, Not Ischemic.  Neurologic Neurologic evaluation reveals -normal attention span and ability to concentrate, able to name objects and repeat phrases. Appropriate fund of knowledge , normal sensation and normal coordination. Mental Status Affect - not angry, not paranoid. Cranial Nerves-Normal Bilaterally. Gait-Normal.  Neuropsychiatric Mental status exam performed with findings of-able to articulate well with normal speech/language, rate, volume and coherence, thought content normal with ability to perform basic computations and apply abstract reasoning and no evidence of hallucinations, delusions, obsessions or homicidal/suicidal ideation.  Musculoskeletal Global Assessment Spine, Ribs and Pelvis - no instability, subluxation or laxity. Right Upper Extremity - no instability, subluxation or laxity.  Lymphatic Head & Neck  General Head & Neck Lymphatics: Bilateral - Description - No Localized lymphadenopathy. Axillary  General Axillary Region: Bilateral - Description - No Localized lymphadenopathy. Femoral & Inguinal  Generalized Femoral & Inguinal Lymphatics: Left - Description - No Localized lymphadenopathy. Right - Description - No Localized lymphadenopathy.     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 Episode of incarceration that required emergent reduction in the emergency room and several days' hospitalization for her obstruction and ileus to resolve.  Standard of care would be hernia repair. Usually I tried do an absorbable phase especially around the small intestine and then a larger sheet of mesh to help minimize recurrence. Sugarbaker-type. Reasonable laparoscopic minimally invasive approach.  At least overnight stay, possibly longer.  With her Crohn's disease, risk of inflammation and erosion is higher. She's not have any active disease in a while. I recommended that she follow up with gastroenterology to make sure that they don't feel like she has any other major issues and she can tolerate that. She is overdue to follow-up with them anyway. She could get a second opinion back at [facility]. She feels comfortable with our group to manage this.  Current Plans You are being scheduled for surgery- Our schedulers will call you.  You should hear from our office's scheduling department within 5 working days about the location, date, and time of surgery. We try to make accommodations for patient's preferences in scheduling surgery, but sometimes the OR schedule or the surgeon's schedule prevents [place] from making those accommodations.  If you have not heard from our office [phone]) in 5 working days, call the office and ask for your surgeon's nurse.  If you have other questions about your diagnosis, plan, or surgery, call the office and ask for your surgeon's nurse.  The anatomy & physiology of the abdominal wall was discussed. The pathophysiology of hernias was discussed. Natural history risks without surgery including progeressive enlargement, pain, incarceration, & strangulation was discussed. Contributors  to complications such as smoking, obesity, diabetes, prior surgery, etc were discussed.  I feel the risks of no intervention will lead to serious problems that outweigh the operative risks; therefore, I recommended surgery to reduce and repair the hernia. I explained laparoscopic techniques with possible need for an open approach. I noted the probable use of mesh to patch and/or buttress the hernia repair  Risks such as bleeding, infection, abscess, need for further treatment, heart attack, death, and other risks were discussed. I noted a good likelihood this will help address the problem. Goals of post-operative recovery were discussed as well. Possibility that this will not correct all symptoms was explained. I stressed the importance of low-impact activity, aggressive pain control, avoiding constipation, & not pushing through pain to minimize risk of post-operative chronic pain or injury. Possibility of reherniation especially with smoking, obesity, diabetes, immunosuppression, and other health conditions was discussed. We will work to minimize complications.  An educational handout further explaining the pathology & treatment options was given as well. Questions were answered. The patient expresses understanding & wishes to proceed with surgery.   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 No evidence of recurrence.  Followed-up with gastroenterology - Dr Hilarie Fredrickson doesn't have any concerns of active Crohn's or other issues   Written instructions provided Pt Education - [facility] Hernia Post-Op HCI (Marayah Higdon): discussed with patient and provided information. Pt Education - [facility] Pain Control (Kendrix Orman) Pt Education - Pamphlet Given - Laparoscopic Hernia Repair: discussed with patient and provided  information. Pt Education - [facility] Mesh education: discussed with patient and provided information.  Adin Hector, MD, FACS, MASCRS Gastrointestinal and Minimally Invasive Surgery    [address], [place], [place] [address] [phone] Main / Paging [phone] Fax

## 2018-11-20 NOTE — Discharge Instructions (Signed)
HERNIA REPAIR: POST OP INSTRUCTIONS  ######################################################################  EAT Gradually transition to a high fiber diet with a fiber supplement over the next few weeks after discharge.  Start with a pureed / full liquid diet (see below)  WALK Walk an hour a day.  Control your pain to do that.    CONTROL PAIN Control pain so that you can walk, sleep, tolerate sneezing/coughing, and go up/down stairs.  HAVE A BOWEL MOVEMENT DAILY Keep your bowels regular to avoid problems.  OK to try a laxative to override constipation.  OK to use an antidairrheal to slow down diarrhea.  Call if not better after 2 tries  CALL IF YOU HAVE PROBLEMS/CONCERNS Call if you are still struggling despite following these instructions. Call if you have concerns not answered by these instructions  ######################################################################    1. DIET: Follow a light bland diet & liquids the first 24 hours after arrival home, such as soup, liquids, starches, etc.  Be sure to drink plenty of fluids.  Quickly advance to a usual solid diet within a few days.  Avoid fast food or heavy meals as your are more likely to get nauseated or have irregular bowels.  A low-fat, high-fiber diet for the rest of your life is ideal.   2. Take your usually prescribed home medications unless otherwise directed.  3. PAIN CONTROL: a. Pain is best controlled by a usual combination of three different methods TOGETHER: i. Ice/Heat ii. Over the counter pain medication iii. Prescription pain medication b. Most patients will experience some swelling and bruising around the hernia(s) such as the bellybutton, groins, or old incisions.  Ice packs or heating pads (30-60 minutes up to 6 times a day) will help. Use ice for the first few days to help decrease swelling and bruising, then switch to heat to help relax tight/sore spots and speed recovery.  Some people prefer to use ice  alone, heat alone, alternating between ice & heat.  Experiment to what works for you.  Swelling and bruising can take several weeks to resolve.   c. It is helpful to take an over-the-counter pain medication regularly for the first few weeks.  Choose one of the following that works best for you: i. Naproxen (Aleve, etc)  Two 24m tabs twice a day ii. Ibuprofen (Advil, etc) Three 2038mtabs four times a day (every meal & bedtime) iii. Acetaminophen (Tylenol, etc) 325-65038mour times a day (every meal & bedtime) d. A  prescription for pain medication should be given to you upon discharge.  Take your pain medication as prescribed.  i. If you are having problems/concerns with the prescription medicine (does not control pain, nausea, vomiting, rash, itching, etc), please call us [phone] see if we need to switch you to a different pain medicine that will work better for you and/or control your side effect better. ii. If you need a refill on your pain medication, please contact your pharmacy.  They will contact our office to request authorization. Prescriptions will not be filled after 5 pm or on week-ends.  4. Avoid getting constipated.  Between the surgery and the pain medications, it is common to experience some constipation.  Increasing fluid intake and taking a fiber supplement (such as Metamucil, Citrucel, FiberCon, MiraLax, etc) 1-2 times a day regularly will usually help prevent this problem from occurring.  A mild laxative (prune juice, Milk of Magnesia, MiraLax, etc) should be taken according to package directions if there are no bowel movements after 48  hours.    5. Wash / shower every day.  You may shower over the dressings as they are waterproof.    6. Remove your waterproof bandages, skin tapes, and other bandages 5 days after surgery. You may replace a dressing/Band-Aid to cover the incision for comfort if you wish. You may leave the incisions open to air.  You may replace a  dressing/Band-Aid to cover an incision for comfort if you wish.  Continue to shower over incision(s) after the dressing is off.  7. ACTIVITIES as tolerated:   a. You may resume regular (light) daily activities beginning the next day--such as daily self-care, walking, climbing stairs--gradually increasing activities as tolerated.  Control your pain so that you can walk an hour a day.  If you can walk 30 minutes without difficulty, it is safe to try more intense activity such as jogging, treadmill, bicycling, low-impact aerobics, swimming, etc. b. Save the most intensive and strenuous activity for last such as sit-ups, heavy lifting, contact sports, etc  Refrain from any heavy lifting or straining until you are off narcotics for pain control.   c. DO NOT PUSH THROUGH PAIN.  Let pain be your guide: If it hurts to do something, don't do it.  Pain is your body warning you to avoid that activity for another week until the pain goes down. d. You may drive when you are no longer taking prescription pain medication, you can comfortably wear a seatbelt, and you can safely maneuver your car and apply brakes. e. Dennis Bast may have sexual intercourse when it is comfortable.   8. FOLLOW UP in our office a. Please call [facility] at [phone] to set up an appointment to see your surgeon in the office for a follow-up appointment approximately 2-3 weeks after your surgery. b. Make sure that you call for this appointment the day you arrive home to insure a convenient appointment time.  9.  If you have disability of FMLA / Family leave forms, please bring the forms to the office for processing.  (do not give to your surgeon).  WHEN TO CALL us [phone]: 1. Poor pain control 2. Reactions / problems with new medications (rash/itching, nausea, etc)  3. Fever over 101.5 F (38.5 C) 4. Inability to urinate 5. Nausea and/or vomiting 6. Worsening swelling or bruising 7. Continued bleeding from incision. 8. Increased pain,  redness, or drainage from the incision   The clinic staff is available to answer your questions during regular business hours (8:30am-5pm).  Please dont hesitate to call and ask to speak to one of our nurses for clinical concerns.   If you have a medical emergency, go to the nearest emergency room or call 911.  A surgeon from [facility] is always on call at the hospitals in [facility], [place], [place], [place], [place]  [address] ?  [address] [address], [place], [place]   [address] MAIN: [phone] ? TOLL FREE: [phone] ? FAX: [phone] [email or web address]

## 2018-11-20 NOTE — Transfer of Care (Signed)
Immediate Anesthesia Transfer of Care Note  Patient: Lauren Figueroa  Procedure(s) Performed: LAPAROSCOPIC REPAIR OF RIGHT INGUINAL HERNIA , FEMORAL HERNIA , INCISIONAL HERNIAS X2 AND  PARASTOMAL HERNIA WITH MESH (N/A ) LAPAROSCOPIC LYSIS OF ADHESIONS  Patient Location: PACU  Anesthesia Type:General  Level of Consciousness: awake, alert  and oriented  Airway & Oxygen Therapy: Patient Spontanous Breathing and Patient connected to face mask oxygen  Post-op Assessment: Report given to RN and Post -op Vital signs reviewed and stable  Post vital signs: Reviewed and stable  Last Vitals:  Vitals Value Taken Time  BP 147/71 11/20/18 1411  Temp    Pulse 89 11/20/18 1413  Resp 11 11/20/18 1413  SpO2 100 % 11/20/18 1413  Vitals shown include unvalidated device data.  Last Pain:  Vitals:   11/20/18 0702  TempSrc: Oral      Patients Stated Pain Goal: 3 (88/33/74 4514)  Complications: No apparent anesthesia complications

## 2018-11-20 NOTE — Op Note (Addendum)
11/20/2018  PATIENT:  Lauren Figueroa  68 y.o. female  Patient Care Team: Pa, [place]ait[place] as PCP - General (Family Medicine) Kem Parkinson, MD as Consulting Physician (Colon and Rectal Surgery) Pyrtle, Lajuan Lines, MD as Consulting Physician (Gastroenterology) Timoteo Gaul, MD as Consulting Physician (Urology) Michael Boston, MD as Consulting Physician (General Surgery)  PRE-OPERATIVE DIAGNOSIS:  PARASTOMAL HERNIA AROUND PERMANENT ILEOSTOMY. HISTORY OF INCARCERATION AND BOWEL OBSTRUCTION.  POST-OPERATIVE DIAGNOSIS:   INCARCERATED PARASTOMAL HERNIA RIGHT INGUINAL HERNIA RIGHT FEMORAL HERNIA INCISIONAL HERNIA X2   PROCEDURE:   LAPAROSCOPIC REPAIR WITH MESH OF: RIGHT INGUINAL HERNIA RIGHT FEMORAL HERNIA INCISIONAL HERNIAS X 2 PARASTOMAL HERNIA  LAPAROSCOPIC LYSIS OF ADHESIONS X 2.5 HOURS (1/2 of case) TAP BLOCK -  BILATERAL   SURGEON:  Adin Hector, MD  ASSISTANT: Nurse   ANESTHESIA:     General  Nerve block provided with liposomal bupivacaine (Experel) mixed with 0.25% bupivacaine as a Bilateral TAP block x 57m each side at the level of the transverse abdominis & preperitoneal spaces along the flank at the anterior axillary line, from subcostal ridge to iliac crest under laparoscopic guidance   EBL:  Total I/O In: 1000 [I.V.:1000] Out: -   Per anesthesia record  Delay start of Pharmacological VTE agent (>24hrs) due to surgical blood loss or risk of bleeding:  no  DRAINS: none   SPECIMEN:  No Specimen  DISPOSITION OF SPECIMEN:  N/A  COUNTS:  YES  PLAN OF CARE: Admit for overnight observation  PATIENT DISPOSITION:  PACU - hemodynamically stable.  INDICATION: Pleasant patient has developed a hernia around her permanent ileostomy.  History of Crohn's colitis with abdominal colectomy with permanent ileostomy.  She came in with bowel obstruction with incarcerated hernia.  Partially reduce but recurred.  No active Crohn's disease  according to her gastroenterologist.  Recommendation made to consider surgical repair .   Recommendation was made for surgical repair:  The anatomy & physiology of the abdominal wall was discussed. The pathophysiology of hernias was discussed. Natural history risks without surgery including progeressive enlargement, pain, incarceration & strangulation was discussed. Contributors to complications such as smoking, obesity, diabetes, prior surgery, etc were discussed.  I feel the risks of no intervention will lead to serious problems that outweigh the operative risks; therefore, I recommended surgery to reduce and repair the hernia. I explained laparoscopic techniques with possible need for an open approach. I noted the probable use of mesh to patch and/or buttress the hernia repair  Risks such as bleeding, infection, abscess, need for further treatment, heart attack, death, and other risks were discussed. I noted a good likelihood this will help address the problem. Goals of post-operative recovery were discussed as well. Possibility that this will not correct all symptoms was explained. I stressed the importance of low-impact activity, aggressive pain control, avoiding constipation, & not pushing through pain to minimize risk of post-operative chronic pain or injury. Possibility of reherniation especially with smoking, obesity, diabetes, immunosuppression, and other health conditions was discussed. We will work to minimize complications.  An educational handout further explaining the pathology & treatment options was given as well. Questions were answered. The patient expresses understanding & wishes to proceed with surgery.   OR FINDINGS: Parastomal hernia around her right lower quadrant ileostomy with about 30 cm of jejunum stuck within it.  No obstruction this time.  Swiss cheese periumbilical hernias 5 x 2 cm region.  Right femoral and indirect inguinal hernias incarcerated with fat.  Loop of  ileum  going up into the right femoral  hernia but reducible.  No evidence of left-sided inguinal or femoral hernias  Type of repair: Laparoscopic underlay repair    Placement of mesh: Intraperitoneal underlay repair & preperitoneal  Name of mesh: Phasix Mesh (a knitted monofilament mesh scaffold using Poly-4-hydroxybutyrate (P4HB), a biologically derived, fully resorbable material) directly abutting ileum.  Ventralight 25 x 20 cm mesh vertically underneath the Phasix mesh and covering the periumbilical ventral hernias as well  Size of mesh: 20x15cm  Orientation: Transverse  Mesh overlap:  5-7cm   DESCRIPTION:   Informed consent was confirmed. The patient underwent general anaesthesia without difficulty. The patient was positioned appropriately. VTE prevention in place. The patient's abdomen was clipped, prepped, & draped in a sterile fashion. Surgical timeout confirmed our plan.  The patient was positioned in reverse Trendelenburg. Abdominal entry was gained using optical entry technique in the left upper abdomen. Entry was clean. I induced carbon dioxide insufflation. Camera inspection revealed no injury. Extra ports were carefully placed under direct laparoscopic visualization.   I could see adhesions on the parietal peritoneum under the abdominal wall.  I did laparoscopic lysis of adhesions to expose the entire anterior abdominal wall.  I primarily used focused sharp dissection.  This took quite some time as there is numerous loops of bowel stuck to the midline and right side of the abdomen.  I freed off the falciform ligament and central peritoneum to expose the retrorectus fascia.  Eventually I was able to get to the ostomy in the right lower quadrant.  There was loop of bowel stuck within it.  After lysis of adhesions I was able to finally reduce it out.  Confirmed that there were periumbilical incisional hernias as well.  Some greater omentum and falciform ligament within that and were able to  be reduced out.  In dissection the patient had an obvious right femoral and right inguinal hernias as well.  I freed the peritoneum off the right lower quadrant and suprapubic region in a TAPP like fashion.  I used that to free and reduce the fat out of the femoral hernia and indirect inguinal hernias as well.  I freed the peritoneum along the right anterior and lateral flanks as well.  I saw no evidence of any left inguinal or femoral hernias so I left the left groin alone.  I then spent a fair amount of time lysing interloop adhesions as well as adhesions of the small bowel to the pelvis and upper abdomen.  This allowed me to untwist the small bowel.  There were numerous internal hernias and bowel trapped and corkscrewed underneath it.  I came at a jejunal side to side anastomosis that was intact.  There is no evidence of any active Crohn's inflammation.  No creeping fat.  Eventually got the small bowel out of the pelvis and untwisted.  Mesentery laid open.  I ran the small bowel from the ileostomy proximally to the ligament of Treitz to confirm no serosal injury other abnormality and anatomy laying well.  I freed redundant ileum and ileocecal mesentery out of the parastomal hernia to help straighten down and reduce it down.  I was able to have enough mobility such that it could lay across the right anterior flank towards the paracolic gutter transversely.  That provided the least tension.  Then did the right inguinal femoral hernia repair with Ultra Pro 15 x 15 cm mesh.  I cut a slit in 1 in the corners such  that there was a 6 x 6 cm flap.  Placed in the abdominal cavity through the supraumbilical hernia.  Dilated over the pubic bone and had an at least 5 cm circumferential coverage around the femoral indirect inguinal hernias.  I secured that above the pubis and the lower anterior abdominal wall with some absorbable secure strap tacks to hold that in place.  I made sure hemostasis was good.  I mapped out  the parastomal hernia using a needle passer.   To ensure that I would have at least 5 cm radial coverage outside of the hernia defect, I chose a 20x15cm Phasix sided mesh.  I placed #1 PDS stitches around its edge about every 5 cm = 10 total.  I rolled the mesh & placed into the peritoneal cavity through the hernia defect.  I unrolled the mesh and positioned it appropriately.  I secured the mesh to cover up the hernia defect using a laparoscopic suture passer to pass the tails of the PDS through the abdominal wall & tagged them with clamps for good transfascial suturing.  I started out the right flank lateral to and just above and inferior to the ileostomy.  I pulled the stitches up such that the Phasix mesh laid like a hammock surrounding the ileum for a classic Sugarbaker type repair.  I then proceeded to pull up the rest of the stitches lateral half.  I then took a #1 PDS suture and passed from the right upper quadrant to help close down the parastomal hernia superiorly with interrupted sutures x2.  I used 1 of those sutures to come in and out of the Phasix mesh to help tack that centrally as well.    To have a more long-term repair as well as cover up the periumbilical hernias I used a 20 x 25 cm Ventralight mesh and placed Prolene sutures around its periphery x10 total.  I rolled up to the supraumbilical hernia defect and laid that out.  Brought those up such that it was more vertically centrally positioned positioned.  Covered up the parastomal hernia and also the supraumbilical ventral hernias as well.  We evacuated CO2 & desufflated the abdomen.  I tied the fascial stitches down. I closed the fascial defect that I placed the mesh through using #1 PDS interrupted transverse stitches primarily.  I reinsufflated the abdomen. The mesh provided at least circumferential coverage around the entire region of hernia defects.  I secured the mesh centrally with an additional trans fascial stitch in & out the mesh  using #1 PDS under laparoscopic visualization.   Also use that to help tack the falciform ligament and superior peritoneum to help hide the upper third of the ventral light mesh.  Not rested in the preperitoneal space.  I brought up the peritoneum from the suprapubic region up and tacked that up such that the ultra pro was completely covered as well as the lower quarter of the ventral light mesh was covered.  The peritoneum can be brought up to cover the Phasix and ventral light mesh lateral to the ileostomy in the preperitoneal plane.  I tacked the edges & central part of the mesh to the peritoneum/posterior rectus fascia with SecureStrap absorbable tacks.   I did reinspection. Hemostasis was good. Mesh laid well. I completed a broad field block of local anesthesia at fascial stitch sites & fascial closure areas.    Capnoperitoneum was evacuated. Ports were removed. The skin was closed with Monocryl at the port sites and  Steri-Strips on the fascial stitch puncture sites.  Patient is being extubated to go to the recovery room.  I discussed operative findings, updated the patient's status, discussed probable steps to recovery, and gave postoperative recommendations to the patient's spouse.  Recommendations were made.  Questions were answered.  He expressed understanding & appreciation.  Adin Hector, M.D., F.A.C.S. Gastrointestinal and Minimally Invasive Surgery [facility] [address] [place], [place] [address] [phone] Main / Paging  11/20/2018 2:08 PM

## 2018-11-21 ENCOUNTER — Encounter (HOSPITAL_COMMUNITY): Payer: Self-pay | Admitting: Surgery

## 2018-11-21 DIAGNOSIS — K432 Incisional hernia without obstruction or gangrene: Secondary | ICD-10-CM

## 2018-11-21 DIAGNOSIS — K4030 Unilateral inguinal hernia, with obstruction, without gangrene, not specified as recurrent: Secondary | ICD-10-CM | POA: Diagnosis not present

## 2018-11-21 DIAGNOSIS — K4090 Unilateral inguinal hernia, without obstruction or gangrene, not specified as recurrent: Secondary | ICD-10-CM

## 2018-11-21 DIAGNOSIS — K4190 Unilateral femoral hernia, without obstruction or gangrene, not specified as recurrent: Secondary | ICD-10-CM

## 2018-11-21 MED ORDER — SODIUM CHLORIDE 0.9% FLUSH
3.0000 mL | Freq: Two times a day (BID) | INTRAVENOUS | Status: DC
  Administered 2018-11-21 – 2018-11-22 (×2): 3 mL via INTRAVENOUS

## 2018-11-21 MED ORDER — SODIUM CHLORIDE 0.9 % IV SOLN: 250.0000 mL | INTRAVENOUS | Status: DC | PRN

## 2018-11-21 MED ORDER — SODIUM CHLORIDE 0.9% FLUSH: 3.0000 mL | INTRAVENOUS | Status: DC | PRN

## 2018-11-21 NOTE — Progress Notes (Signed)
Lauren Figueroa [id number] [birth year]  CARE TEAM:  PCP: Pa, [place] Team: Patient Care Team: Pa, [place] as PCP - General (Family Medicine) Kem Parkinson, MD as Consulting Physician (Colon and Rectal Surgery) Pyrtle, Lajuan Lines, MD as Consulting Physician (Gastroenterology) Timoteo Gaul, MD as Consulting Physician (Urology) Michael Boston, MD as Consulting Physician (General Surgery)  Inpatient Treatment Team: Treatment Team: Attending Provider: Michael Boston, MD; Registered Nurse: Jennye Boroughs, RN; Technician: Leda Quail, NT; Registered Nurse: Arminda Resides, RN; Utilization Review: Sindy Guadeloupe, RN   Problem List:   Principal Problem:   Parastomal hernia at ileostomy s/p lap repair w mesh 11/20/2018 Active Problems:   Crohn's disease ([place])   Crohn's colitis, with fistula, APR/SB resction/bladder repair/ileostomy 2017 at [facility]   Right inguinal hernia s/p lap repair w mesh 11/20/2018   Incisional hernia s/p lap repair w mesh 11/20/2018   Femoral hernia of right side s/p lap repair w mesh 11/20/2018   1 Day Post-Op  11/20/2018  POST-OPERATIVE DIAGNOSIS:   INCARCERATED PARASTOMAL HERNIA RIGHT INGUINAL HERNIA RIGHT FEMORAL HERNIA INCISIONAL HERNIA X2   PROCEDURE:   LAPAROSCOPIC REPAIR WITH MESH OF: RIGHT INGUINAL HERNIA RIGHT FEMORAL HERNIA INCISIONAL HERNIAS X 2 PARASTOMAL HERNIA  LAPAROSCOPIC LYSIS OF ADHESIONS X 2.5 HOURS (1/2 of case) TAP BLOCK -  BILATERAL   SURGEON:  Adin Hector, MD  Assessment  Doing relatively well so far  [facility] Stay = 0 days)  Plan:  -pain control -wean off IVF -adv diet gradually -ostomy care -VTE prophylaxis- SCDs, etc -mobilize as tolerated to help recovery  25 minutes spent in review, evaluation, examination, counseling, and coordination of care.  More than 50% of that time was spent in counseling.  I discussed  operative findings, updated the patient's status, discussed probable steps to recovery, and gave postoperative recommendations to the patient.  Recommendations were made.  Questions were answered.  She expressed understanding & appreciation.   11/21/2018    Subjective: (Chief complaint)  Some irritation with a binder.  Pain control.  Not nauseated.  Tolerating liquids.  Objective:  Vital signs:  Vitals:   11/20/18 1953 11/20/18 2126 11/21/18 0155 11/21/18 0614  BP: 138/88 (!) 142/80 110/64 112/64  Pulse: 62 76 70 66  Resp: 18 18 18 18   Temp: 97.6 F (36.4 C) 97.7 F (36.5 C) 98.1 F (36.7 C) 97.9 F (36.6 C)  TempSrc: Oral Oral Oral Oral  SpO2: 99% 96% 97% 97%  Weight:      Height:        Last BM Date: 11/20/18  Intake/Output   Yesterday:  07/08 0701 - 07/09 0700 In: 3001.6 [P.O.:850; I.V.:1893.3; IV Piggyback:258.3] Out: 1650 [Urine:1600; Blood:50] This shift:  Total I/O In: 1233.3 [P.O.:720; I.V.:425; IV Piggyback:88.3] Out: 1200 [Urine:1200]  Bowel function:  Flatus: YES  BM:  YES  Drain: (No drain)   Physical Exam:  General: Pt awake/alert/oriented x4 in no acute distress.  Sitting up, smiling. Eyes: PERRL, normal EOM.  Sclera clear.  No icterus Neuro: CN II-XII intact w/o focal sensory/motor deficits. Lymph: No head/neck/groin lymphadenopathy Psych:  No delerium/psychosis/paranoia HENT: Normocephalic, Mucus membranes moist.  No thrush Neck: Supple, No tracheal deviation Chest: No chest wall pain w good excursion CV:  Pulses intact.  Regular rhythm MS: Normal AROM mjr joints.  No obvious deformity  Abdomen: Soft.  Nondistended.  Mildly tender at incisions only.  No evidence of peritonitis.  No incarcerated hernias.  Ext:  No deformity.  No mjr edema.  No cyanosis Skin: No petechiae / purpura  Results:   Cultures: Recent Results (from the past 720 hour(s))  SARS Coronavirus 2 (Performed in [place] hospital lab)     Status: None    Collection Time: 11/15/18 12:51 PM   Specimen: Nasal Swab  Result Value Ref Range Status   SARS Coronavirus 2 NEGATIVE NEGATIVE Final    Comment: (NOTE) SARS-CoV-2 target nucleic acids are NOT DETECTED. The SARS-CoV-2 RNA is generally detectable in upper and lower respiratory specimens during the acute phase of infection. Negative results do not preclude SARS-CoV-2 infection, do not rule out co-infections with other pathogens, and should not be used as the sole basis for treatment or other patient management decisions. Negative results must be combined with clinical observations, patient history, and epidemiological information. The expected result is Negative. Fact Sheet for Patients: SugarRoll.be Fact Sheet for Healthcare Providers: https://www.woods-mathews.com/ This test is not yet approved or cleared by the [place] FDA and  has been authorized for detection and/or diagnosis of SARS-CoV-2 by FDA under an Emergency Use Authorization (EUA). This EUA will remain  in effect (meaning this test can be used) for the duration of the COVID-19 declaration under Section 56 4(b)(1) of the Act, 21 U.S.C. section 360bbb-3(b)(1), unless the authorization is terminated or revoked sooner. Performed at [facility], [place] [address] [place], [place] [address]     Labs: No results found for this or any previous visit (from the past 48 hour(s)).  Imaging / Studies: No results found.  Medications / Allergies: per chart  Antibiotics: Anti-infectives (From admission, onward)   Start     Dose/Rate Route Frequency Ordered Stop   11/20/18 1800  clindamycin (CLEOCIN) IVPB 600 mg     600 mg 100 mL/hr over 30 Minutes Intravenous Every 6 hours 11/20/18 1618 11/21/18 1159   11/20/18 0600  clindamycin (CLEOCIN) IVPB 900 mg     900 mg 100 mL/hr over 30 Minutes Intravenous On call to O.R. 11/19/18 0757 11/20/18 0841   11/20/18 0600  gentamicin  (GARAMYCIN) 330 mg in dextrose 5 % 100 mL IVPB     5 mg/kg  65.8 kg 108.3 mL/hr over 60 Minutes Intravenous On call to O.R. 11/19/18 0757 11/20/18 0907        Note: Portions of this report may have been transcribed using voice recognition software. Every effort was made to ensure accuracy; however, inadvertent computerized transcription errors may be present.   Any transcriptional errors that result from this process are unintentional.     Adin Hector, MD, FACS, MASCRS Gastrointestinal and Minimally Invasive Surgery    [address] [place], [place] [address] [phone] Main / Paging [phone] Fax

## 2018-11-22 DIAGNOSIS — K4030 Unilateral inguinal hernia, with obstruction, without gangrene, not specified as recurrent: Secondary | ICD-10-CM | POA: Diagnosis not present

## 2018-11-22 MED ORDER — OXYCODONE HCL 5 MG PO TABS: 5.0000 mg | ORAL_TABLET | ORAL | 0 refills | Status: DC | PRN

## 2018-11-22 MED ORDER — GABAPENTIN 300 MG PO CAPS: 300.0000 mg | ORAL_CAPSULE | Freq: Two times a day (BID) | ORAL | 1 refills | Status: DC

## 2018-11-22 NOTE — Discharge Summary (Signed)
Physician Discharge Summary    Patient ID: Lauren Figueroa MRN: [id number] DOB/AGE: [birth year]  68 y.o.  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 s/p lap repair w mesh 11/20/2018 Active Problems:   Crohn's disease ([place])   Crohn's colitis, with fistula, APR/SB resction/bladder repair/ileostomy 2017 at [facility]   Right inguinal hernia s/p lap repair w mesh 11/20/2018   Incisional hernia s/p lap repair w mesh 11/20/2018   Femoral hernia of right side s/p lap repair w mesh 11/20/2018   2 Days Post-Op  11/20/2018  POST-OPERATIVE DIAGNOSIS:   INCARCERATED PARASTOMAL HERNIA RIGHT INGUINAL HERNIA RIGHT FEMORAL HERNIA INCISIONAL HERNIA X2   PROCEDURE:   LAPAROSCOPIC REPAIR WITH MESH OF: RIGHT INGUINAL HERNIA RIGHT FEMORAL HERNIA INCISIONAL HERNIAS X 2 PARASTOMAL HERNIA  LAPAROSCOPIC LYSIS OF ADHESIONS X 2.5 HOURS (1/2 of case) TAP BLOCK -  BILATERAL   SURGEON:  Adin Hector, MD  OR FINDINGS:  Parastomal hernia around her right lower quadrant ileostomy with about 30 cm of jejunum stuck within it.  No obstruction this time.  Swiss cheese periumbilical hernias 5 x 2 cm region.  Right femoral and indirect inguinal hernias incarcerated with fat.  Loop of ileum going up into the right femoral  hernia but reducible.  No evidence of left-sided inguinal or femoral hernias   Consults: None  Hospital Course:   The patient underwent the surgery above.  Postoperatively, the patient gradually mobilized and advanced to a solid  diet.  Pain and other symptoms were treated aggressively.    By the time of discharge, the patient was walking well the hallways, eating food, having flatus.  Pain was well-controlled on an oral medications.  Based on meeting discharge criteria and continuing to recover, I felt it was safe for the patient to be discharged from the hospital to further recover with close followup. Postoperative recommendations were discussed in detail.  They are written as well.  Discharged Condition: good  Discharge Exam: Blood pressure 104/66, pulse 67, temperature 98.7 F (37.1 C), temperature source Oral, resp. rate 18, height 5' 4"  (1.626 m), weight 61.7 kg, SpO2 90 %.  General: Pt awake/alert/oriented x4 in No acute distress Eyes: PERRL, normal EOM.  Sclera clear.  No icterus Neuro: CN II-XII intact w/o focal sensory/motor deficits. Lymph: No head/neck/groin lymphadenopathy Psych:  No delerium/psychosis/paranoia HENT: Normocephalic, Mucus membranes moist.  No thrush Neck: Supple, No tracheal deviation Chest: No chest wall pain w good excursion CV:  Pulses intact.  Regular rhythm MS: Normal AROM mjr joints.  No obvious deformity Abdomen: Soft.  Mildly distended.  Mildly tender at incisions only.  Ileostomy pink w bag in place - no leak.  +gas/succus.  No evidence of peritonitis.  No incarcerated hernias. Ext:  SCDs BLE.  No mjr edema.  No cyanosis Skin: No petechiae / purpura   Disposition:   Follow-up Information    Michael Boston, MD. Schedule an appointment as soon as possible for a visit in 3 weeks.   Specialty: General Surgery Why: To follow up after your operation, To follow up after your hospital stay Contact information: [address] [place] [address] [phone]               Allergies as of 11/22/2018      Reactions   Cyclobenzaprine    Other reaction(s): Dizziness, hallucinations   Nsaids    Crohn's disease / IBD   Ciprofloxacin Nausea Only   Flushing    Codeine Nausea And Vomiting   Metronidazole Nausea And Vomiting   Penicillins Itching   Crawling out of my skin, burning Did it involve swelling of the face/tongue/throat, SOB, or low BP? No Did it involve sudden or severe rash/hives, skin peeling, or any reaction on the inside of your mouth or nose? No Did you need to seek medical attention at a hospital or doctor's office? Yes, was admitted at the time When did it last happen?2011/2012 If all above answers are "NO", may proceed with cephalosporin use.   Sulfa Antibiotics Nausea And Vomiting      Medication List    TAKE these medications   cholecalciferol 1000 units tablet Commonly known as: VITAMIN D Take 2,000 Units by mouth daily.   gabapentin 300 MG capsule Commonly known as: NEURONTIN Take 1 capsule (300 mg total) by mouth 2 (two) times daily.   multivitamin tablet Take 1 tablet by mouth daily.   oxyCODONE 5 MG immediate release tablet Commonly known as: Oxy IR/ROXICODONE Take 1-2 tablets (5-10 mg total) by mouth every 4 (four) hours as needed for moderate pain.       Significant Diagnostic Studies:  No results found for this or any previous visit (from the past 72 hour(s)).  No results found.  Past Medical History:  Diagnosis Date  . Anal stricture s/p APR resection   . Anemia   . Arthritis    left hip,lower spine,hands  . Blood transfusion without reported diagnosis   . Crohn's colitis, with fistula, LAR/bladder epair/ileostomy 2017 at [facility] 07/03/2018  . Crohn's disease with fistula ([place])   . Enterocolic fistula 6195   Crohns disease  . 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 Fistula of intestine, excluding rectum and anus   . INTRAABDOMINAL ABSCESS 08/11/2008   Qualifier: Diagnosis of  By: Sharlett Iles MD Byrd Hesselbach   . Osteoporosis, unspecified   . Parastomal hernia   . 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 Unspecified hemorrhoids without mention of complication   . Unspecified intestinal obstruction     Past Surgical History:  Procedure Laterality Date  . ABDOMINOPERINEAL PROCTOCOLECTOMY  03/21/2016   Dr Sheryn Bison, [facility].  Ms. Hoppes had a complex phlegmon around the sigmoid colon which was fistulized to the SB and bladder. This complex could not be managed laparoscopically. The entire colon and rectum were diseased and abnormal with changes of chronic IBD. The colon was particularly friable on the left, proximal to the obstructing stricture and fistula. The bladder is scarred to the left and an  . APPENDECTOMY    . BLADDER REPAIR  [id number]  . COLONOSCOPY    . LAPAROSCOPIC LYSIS OF ADHESIONS  11/20/2018   Procedure: LAPAROSCOPIC LYSIS OF ADHESIONS;  Surgeon: Michael Boston, MD;  Location: [facility];  Service: General;;  . LAPAROSCOPIC PARASTOMAL HERNIA N/A 11/20/2018   Procedure: LAPAROSCOPIC REPAIR OF RIGHT INGUINAL HERNIA , FEMORAL HERNIA , INCISIONAL HERNIAS X2 AND  PARASTOMAL HERNIA WITH MESH;  Surgeon: Michael Boston, MD;  Location: [facility];  Service: General;  Laterality: N/A;  . PERMANENT ILEOSTOMY  03/21/2016   Dr Sheryn Bison, [facility]  . SMALL INTESTINE SURGERY  03/21/2016  . TOTAL COLECTOMY  03/21/2016  . TUBAL LIGATION    . VAGINAL SEPTUM RESECTION  03/21/2016   Posterior vaginectomy for RV fistula    Social History   Socioeconomic History  . Marital status: Married    Spouse name: Not on file  . Number of children: 2  . Years of education: Not on file  . Highest education level: Not on file  Occupational History  . 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 Financial resource strain: Not on file  . Food insecurity    Worry: Not on file    Inability: Not on file  . Transportation needs    Medical: Not on file    Non-medical: Not on file  Tobacco Use  . Smoking status: Former Smoker    Packs/day: 0.25    Years:  5.00    Pack years: 1.25    Types: Cigarettes    Quit date: 1974    Years since quitting: 46.5  . Smokeless tobacco: Never Used  Substance and Sexual Activity  . Alcohol use: Yes    Comment: occasional wine  . Drug use: No  . Sexual activity: Not on file  Lifestyle  . Physical activity    Days per week: Not on file    Minutes per session: Not on file  . Stress: Not on file  Relationships  . Social Herbalist on phone: Not on file    Gets together: Not on file    Attends religious service: Not on file    Active member of club or organization: Not on file    Attends meetings of clubs or organizations: Not on file    Relationship status: Not on file  . Intimate partner violence    Fear of current or ex partner: Not on file    Emotionally abused: Not on file    Physically abused: Not on file    Forced sexual activity: Not on file  Other Topics Concern  . Not on file  Social History Narrative  . Not on file    Family History  Problem Relation Age of Onset  . Diabetes Maternal Grandfather   . Heart disease Father   . Lung cancer Father   . Heart disease Mother   . Diabetes Maternal Grandmother   . Diabetes Paternal Grandmother   . Diabetes Paternal Grandfather   . Crohn's disease Daughter   . Colon cancer Neg Hx   . Esophageal cancer Neg Hx   . Rectal cancer Neg Hx   . Stomach cancer Neg Hx     Current Facility-Administered Medications  Medication Dose Route Frequency Provider Last Rate Last Dose  . 0.9 %  sodium chloride infusion  250 mL Intravenous PRN Michael Boston, MD      . acetaminophen (TYLENOL) tablet 1,000 mg  1,000 mg Oral Tor Netters, MD   1,000 mg at 11/22/18 0640  . alum & mag hydroxide-simeth (MAALOX/MYLANTA) 200-200-20 MG/5ML suspension 30 mL  30 mL Oral Q6H PRN Michael Boston, MD      . bisacodyl (DULCOLAX) suppository 10 mg  10 mg Rectal Daily PRN Michael Boston, MD      . cholecalciferol (VITAMIN D3) tablet 2,000 Units  2,000 Units  Oral Daily Michael Boston, MD   2,000 Units at  11/21/18 0932  . diphenhydrAMINE (BENADRYL) 12.5 MG/5ML elixir 12.5 mg  12.5 mg Oral Q6H PRN Michael Boston, MD       Or  . diphenhydrAMINE (BENADRYL) injection 12.5 mg  12.5 mg Intravenous Q6H PRN Michael Boston, MD      . enoxaparin (LOVENOX) injection 40 mg  40 mg Subcutaneous Q24H Michael Boston, MD   40 mg at 11/21/18 0755  . gabapentin (NEURONTIN) capsule 300 mg  300 mg Oral BID Michael Boston, MD   300 mg at 11/21/18 2219  . guaiFENesin-dextromethorphan (ROBITUSSIN DM) 100-10 MG/5ML syrup 10 mL  10 mL Oral Q4H PRN Michael Boston, MD      . hydrALAZINE (APRESOLINE) injection 5-20 mg  5-20 mg Intravenous Q4H PRN Michael Boston, MD      . hydrocortisone (ANUSOL-HC) 2.5 % rectal cream 1 application  1 application Topical QID PRN Michael Boston, MD      . hydrocortisone cream 1 % 1 application  1 application Topical TID PRN Michael Boston, MD      . HYDROmorphone (DILAUDID) injection 0.5-2 mg  0.5-2 mg Intravenous Q2H PRN Michael Boston, MD   1 mg at 11/21/18 1609  . lactated ringers bolus 1,000 mL  1,000 mL Intravenous Q8H PRN Michael Boston, MD      . lactated ringers infusion 1,000 mL  1,000 mL Intravenous Q8H PRN Caeden Foots, Remo Lipps, MD      . lip balm (CARMEX) ointment 1 application  1 application Topical BID Michael Boston, MD   1 application at 12/27/46 2219  . magic mouthwash  15 mL Oral QID PRN Michael Boston, MD      . menthol-cetylpyridinium (CEPACOL) lozenge 3 mg  1 lozenge Oral PRN Michael Boston, MD      . methocarbamol (ROBAXIN) 1,000 mg in dextrose 5 % 50 mL IVPB  1,000 mg Intravenous Q6H PRN Michael Boston, MD      . methocarbamol (ROBAXIN) tablet 1,000 mg  1,000 mg Oral Q6H PRN Michael Boston, MD      . metoprolol tartrate (LOPRESSOR) injection 5 mg  5 mg Intravenous Q6H PRN Michael Boston, MD      . multivitamin with minerals tablet 1 tablet  1 tablet Oral Daily Michael Boston, MD   1 tablet at 11/21/18 0932  . ondansetron (ZOFRAN-ODT) disintegrating  tablet 4 mg  4 mg Oral Q6H PRN Michael Boston, MD       Or  . ondansetron [facility]) injection 4 mg  4 mg Intravenous Q6H PRN Michael Boston, MD   4 mg at 11/22/18 0311  . oxyCODONE (Oxy IR/ROXICODONE) immediate release tablet 5-10 mg  5-10 mg Oral Q4H PRN Michael Boston, MD   5 mg at 11/21/18 2231  . phenol (CHLORASEPTIC) mouth spray 1-2 spray  1-2 spray Mouth/Throat PRN Michael Boston, MD      . polyethylene glycol (MIRALAX / GLYCOLAX) packet 17 g  17 g Oral Daily PRN Michael Boston, MD      . polyethylene glycol (MIRALAX / GLYCOLAX) packet 17 g  17 g Oral BID Michael Boston, MD   17 g at 11/21/18 2219  . prochlorperazine (COMPAZINE) tablet 10 mg  10 mg Oral Q6H PRN Michael Boston, MD       Or  . prochlorperazine (COMPAZINE) injection 5-10 mg  5-10 mg Intravenous Q6H PRN Michael Boston, MD      . simethicone (MYLICON) chewable tablet 40 mg  40 mg Oral Q6H PRN Michael Boston, MD      . sodium chloride  flush (NS) 0.9 % injection 3 mL  3 mL Intravenous Gorden Harms, MD   3 mL at 11/21/18 2219  . sodium chloride flush (NS) 0.9 % injection 3 mL  3 mL Intravenous PRN Michael Boston, MD      . zolpidem (AMBIEN) tablet 5 mg  5 mg Oral QHS PRN Michael Boston, MD         Allergies  Allergen Reactions  . Cyclobenzaprine     Other reaction(s): Dizziness, hallucinations  . Nsaids     Crohn's disease / IBD  . Ciprofloxacin Nausea Only    Flushing  . Codeine Nausea And Vomiting  . Metronidazole Nausea And Vomiting  . Penicillins Itching    Crawling out of my skin, burning Did it involve swelling of the face/tongue/throat, SOB, or low BP? No Did it involve sudden or severe rash/hives, skin peeling, or any reaction on the inside of your mouth or nose? No Did you need to seek medical attention at a hospital or doctor's office? Yes, was admitted at the time When did it last happen?2011/2012 If all above answers are "NO", may proceed with cephalosporin use.   . Sulfa Antibiotics Nausea And Vomiting     Signed: Morton Peters, MD, FACS, MASCRS Gastrointestinal and Minimally Invasive Surgery    [address], [place], [place] [address] [phone] Main / Paging [phone] Fax   11/22/2018, 7:24 AM

## 2018-11-22 NOTE — Progress Notes (Signed)
Discharge instructions given to pt and all questions were answered.  

## 2018-11-22 NOTE — Anesthesia Postprocedure Evaluation (Signed)
Anesthesia Post Note  Patient: JUDEA FENNIMORE  Procedure(s) Performed: LAPAROSCOPIC REPAIR OF RIGHT INGUINAL HERNIA , FEMORAL HERNIA , INCISIONAL HERNIAS X2 AND  PARASTOMAL HERNIA WITH MESH (N/A ) LAPAROSCOPIC LYSIS OF ADHESIONS     Patient location during evaluation: PACU Anesthesia Type: General Level of consciousness: sedated Pain management: pain level controlled Vital Signs Assessment: post-procedure vital signs reviewed and stable Respiratory status: spontaneous breathing Cardiovascular status: stable Postop Assessment: no apparent nausea or vomiting Anesthetic complications: no    Last Vitals:  Vitals:   11/21/18 2152 11/22/18 0442  BP: 111/74 104/66  Pulse: 75 67  Resp: 18 18  Temp: 37.4 C 37.1 C  SpO2: 93% 90%    Last Pain:  Vitals:   11/22/18 1022  TempSrc:   PainSc: 4    Pain Goal: Patients Stated Pain Goal: 2 (11/22/18 1022)                 Huston Foley

## 2019-02-13 ENCOUNTER — Encounter: Payer: Self-pay | Admitting: Physician Assistant

## 2019-02-13 ENCOUNTER — Encounter (HOSPITAL_BASED_OUTPATIENT_CLINIC_OR_DEPARTMENT_OTHER): Payer: Self-pay | Admitting: *Deleted

## 2019-02-13 ENCOUNTER — Other Ambulatory Visit: Payer: Self-pay

## 2019-02-13 ENCOUNTER — Other Ambulatory Visit (HOSPITAL_COMMUNITY)
Admission: RE | Admit: 2019-02-13 | Discharge: 2019-02-13 | Disposition: A | Discharge status: Home / Self Care | Payer: Medicare Other | Source: Ambulatory Visit | Attending: Orthopedic Surgery | Admitting: Orthopedic Surgery

## 2019-02-13 ENCOUNTER — Other Ambulatory Visit: Payer: Self-pay | Admitting: Physician Assistant

## 2019-02-13 DIAGNOSIS — Z20828 Contact with and (suspected) exposure to other viral communicable diseases: Secondary | ICD-10-CM | POA: Diagnosis not present

## 2019-02-13 DIAGNOSIS — Z01812 Encounter for preprocedural laboratory examination: Secondary | ICD-10-CM | POA: Diagnosis present

## 2019-02-13 DIAGNOSIS — S82032A Displaced transverse fracture of left patella, initial encounter for closed fracture: Secondary | ICD-10-CM

## 2019-02-13 DIAGNOSIS — S52501A Unspecified fracture of the lower end of right radius, initial encounter for closed fracture: Secondary | ICD-10-CM

## 2019-02-13 DIAGNOSIS — S52531A Colles' fracture of right radius, initial encounter for closed fracture: Secondary | ICD-10-CM

## 2019-02-13 HISTORY — DX: Unspecified fracture of the lower end of right radius, initial encounter for closed fracture: S52.501A

## 2019-02-13 HISTORY — DX: Displaced transverse fracture of left patella, initial encounter for closed fracture: S82.032A

## 2019-02-13 NOTE — H&P (Signed)
Lauren Figueroa is an 68 y.o. female.   Chief Complaint: Left knee patella fracture   Right wrist distal radius fracture HPI: 27 yowf fell on her morning walk this morning injuring her left knee and right wrist.  She presented to our office for outpatient evaluation.  Xrays showed displaced patella fracture and and comminuted distal radius fracture in a very osteopenic bone.    Past Medical History:  Diagnosis Date  . Anal stricture s/p APR resection   . Anemia   . Arthritis    left hip,lower spine,hands  . Blood transfusion without reported diagnosis   . Crohn's colitis, with fistula, LAR/bladder epair/ileostomy 2017 at [facility] 07/03/2018  . Crohn's disease with fistula ([place])   . Enterocolic fistula 2778   Crohns disease  . 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 Fistula of intestine, excluding rectum and anus   . INTRAABDOMINAL ABSCESS 08/11/2008   Qualifier: Diagnosis of  By: Sharlett Iles MD Byrd Hesselbach   . Osteoporosis, unspecified   . Parastomal hernia   . 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 Stricture of sigmoid colon from Crohns s/p LAR resection/ileostomy 2017 at [facility] 07/03/2018  . Unspecified hemorrhoids without mention of complication   . Unspecified intestinal obstruction     Past Surgical History:  Procedure Laterality Date  . ABDOMINOPERINEAL PROCTOCOLECTOMY  03/21/2016   Dr Sheryn Bison, [facility].  Ms. Gupton had a complex phlegmon around the sigmoid colon which was fistulized to the SB and bladder. This complex could not be managed laparoscopically. The entire colon and rectum were diseased and abnormal with changes of chronic IBD. The colon was particularly friable on the left, proximal to the obstructing stricture and fistula. The bladder is scarred to the left and an  . APPENDECTOMY    . BLADDER REPAIR  [id number]  . COLONOSCOPY    . LAPAROSCOPIC LYSIS OF ADHESIONS  11/20/2018   Procedure: LAPAROSCOPIC LYSIS OF ADHESIONS;  Surgeon: Michael Boston,  MD;  Location: [facility];  Service: General;;  . LAPAROSCOPIC PARASTOMAL HERNIA N/A 11/20/2018   Procedure: LAPAROSCOPIC REPAIR OF RIGHT INGUINAL HERNIA , FEMORAL HERNIA , INCISIONAL HERNIAS X2 AND  PARASTOMAL HERNIA WITH MESH;  Surgeon: Michael Boston, MD;  Location: [facility];  Service: General;  Laterality: N/A;  . PERMANENT ILEOSTOMY  03/21/2016   Dr Sheryn Bison, [facility]  . SMALL INTESTINE SURGERY  03/21/2016  . TOTAL COLECTOMY  03/21/2016  . TUBAL LIGATION    . VAGINAL SEPTUM RESECTION  03/21/2016   Posterior vaginectomy for RV fistula    Family History  Problem Relation Age of Onset  . Diabetes Maternal Grandfather   . Heart disease Father   . Lung cancer Father   . Heart disease Mother   . Diabetes Maternal Grandmother   . Diabetes Paternal Grandmother   . Diabetes Paternal Grandfather   . Crohn's disease Daughter   . Colon cancer Neg Hx   . Esophageal cancer Neg Hx   . Rectal cancer Neg Hx   . Stomach cancer Neg Hx    Social History:  reports that she quit smoking about 46 years ago. Her smoking use included cigarettes. She has a 1.25 pack-year smoking history. She has never used smokeless tobacco. She reports current alcohol use. She reports that she does not use drugs.  Allergies:  Allergies  Allergen Reactions  . Cyclobenzaprine     Other reaction(s): Dizziness, hallucinations  . Nsaids     Crohn's disease / IBD  .  Ciprofloxacin Nausea Only    Flushing  . Codeine Nausea And Vomiting  . Metronidazole Nausea And Vomiting  . Penicillins Itching    Crawling out of my skin, burning Did it involve swelling of the face/tongue/throat, SOB, or low BP? No Did it involve sudden or severe rash/hives, skin peeling, or any reaction on the inside of your mouth or nose? No Did you need to seek medical attention at a hospital or doctor's office? Yes, was admitted at the time When did it last happen?2011/2012 If all above answers are "NO", may proceed with cephalosporin use.   . Sulfa  Antibiotics Nausea And Vomiting    (Not in a hospital admission)   No results found for this or any previous visit (from the past 48 hour(s)). No results found.  Review of Systems  Constitutional: Negative.   HENT: Negative.   Eyes: Negative.   Respiratory: Negative.   Cardiovascular: Negative.   Gastrointestinal: Negative.   Genitourinary: Negative.   Musculoskeletal: Positive for falls and joint pain.  Skin: Negative.   Neurological: Negative.   Endo/Heme/Allergies: Negative.   Psychiatric/Behavioral: Negative.     There were no vitals taken for this visit. Physical Exam  Constitutional: She is oriented to person, place, and time. She appears well-developed and well-nourished.  HENT:  Head: Normocephalic and atraumatic.  Mouth/Throat: Oropharynx is clear and moist.  Eyes: EOM are normal.  Neck: Neck supple.  Cardiovascular: Normal rate.  Respiratory: Effort normal.  GI: Soft.  Genitourinary:    Genitourinary Comments: Not pertinent to current symptomatology therefore not examined.   Musculoskeletal:     Comments: Left knee 3+ effusion diffuse pain.  Pain with motion  Skin intact.  Right knee has full range of motion without pain, swelling, or deformity.  Right wrist pain and swelling.  DNVI    Neurological: She is alert and oriented to person, place, and time.  Skin: Skin is warm and dry.  Psychiatric: She has a normal mood and affect. Her behavior is normal.     Assessment Left knee displaced patella fracture Right wrist distal radius fracture   Plan Open reduction internal fixation left knee patella fracture.  The risks, benefits, and possible complications of the procedure were discussed in detail with the patient.  The patient is without question. Lauren Delval J Hailei Besser, PA-C 02/13/2019, 10:58 AM

## 2019-02-13 NOTE — H&P (View-Only) (Signed)
Lauren Figueroa is an 68 y.o. female.   Chief Complaint: Left knee patella fracture   Right wrist distal radius fracture HPI: 64 yowf fell on her morning walk this morning injuring her left knee and right wrist.  She presented to our office for outpatient evaluation.  Xrays showed displaced patella fracture and and comminuted distal radius fracture in a very osteopenic bone.    Past Medical History:  Diagnosis Date  . Anal stricture s/p APR resection   . Anemia   . Arthritis    left hip,lower spine,hands  . Blood transfusion without reported diagnosis   . Crohn's colitis, with fistula, LAR/bladder epair/ileostomy 2017 at [facility] 07/03/2018  . Crohn's disease with fistula ([place])   . 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 Fistula of intestine, excluding rectum and anus   . INTRAABDOMINAL ABSCESS 08/11/2008   Qualifier: Diagnosis of  By: Lauren Iles MD Lauren Figueroa   . Osteoporosis, unspecified   . Parastomal hernia   . 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 Unspecified hemorrhoids without mention of complication   . Unspecified intestinal obstruction     Past Surgical History:  Procedure Laterality Date  . ABDOMINOPERINEAL PROCTOCOLECTOMY  03/21/2016   Dr Lauren Figueroa, [facility].  [name] had a complex phlegmon around the sigmoid colon which was fistulized to the SB and bladder. This complex could not be managed laparoscopically. The entire colon and rectum were diseased and abnormal with changes of chronic IBD. The colon was particularly friable on the left, proximal to the obstructing stricture and fistula. The bladder is scarred to the left and an  . APPENDECTOMY    . BLADDER REPAIR  [id number]  . COLONOSCOPY    . LAPAROSCOPIC LYSIS OF ADHESIONS  11/20/2018   Procedure: LAPAROSCOPIC LYSIS OF ADHESIONS;  Surgeon: Lauren Boston,  MD;  Location: [facility];  Service: General;;  . LAPAROSCOPIC PARASTOMAL HERNIA N/A 11/20/2018   Procedure: LAPAROSCOPIC REPAIR OF RIGHT INGUINAL HERNIA , FEMORAL HERNIA , INCISIONAL HERNIAS X2 AND  PARASTOMAL HERNIA WITH MESH;  Surgeon: Lauren Boston, MD;  Location: [facility];  Service: General;  Laterality: N/A;  . PERMANENT ILEOSTOMY  03/21/2016   Dr Lauren Figueroa, [facility]  . SMALL INTESTINE SURGERY  03/21/2016  . TOTAL COLECTOMY  03/21/2016  . TUBAL LIGATION    . VAGINAL SEPTUM RESECTION  03/21/2016   Posterior vaginectomy for RV fistula    Family History  Problem Relation Age of Onset  . Diabetes Maternal Grandfather   . Heart disease Father   . Lung cancer Father   . Heart disease Mother   . Diabetes Maternal Grandmother   . Diabetes Paternal Grandmother   . Diabetes Paternal Grandfather   . Crohn's disease Daughter   . Colon cancer Neg Hx   . Esophageal cancer Neg Hx   . Rectal cancer Neg Hx   . Stomach cancer Neg Hx    Social History:  reports that she quit smoking about 46 years ago. Her smoking use included cigarettes. She has a 1.25 pack-year smoking history. She has never used smokeless tobacco. She reports current alcohol use. She reports that she does not use drugs.  Allergies:  Allergies  Allergen Reactions  . Cyclobenzaprine     Other reaction(s): Dizziness, hallucinations  . Nsaids     Crohn's disease / IBD  .  Ciprofloxacin Nausea Only    Flushing  . Codeine Nausea And Vomiting  . Metronidazole Nausea And Vomiting  . Penicillins Itching    Crawling out of my skin, burning Did it involve swelling of the face/tongue/throat, SOB, or low BP? No Did it involve sudden or severe rash/hives, skin peeling, or any reaction on the inside of your mouth or nose? No Did you need to seek medical attention at a hospital or doctor's office? Yes, was admitted at the time When did it last happen?2011/2012 If all above answers are "NO", may proceed with cephalosporin use.   . Sulfa  Antibiotics Nausea And Vomiting    (Not in a hospital admission)   No results found for this or any previous visit (from the past 48 hour(s)). No results found.  Review of Systems  Constitutional: Negative.   HENT: Negative.   Eyes: Negative.   Respiratory: Negative.   Cardiovascular: Negative.   Gastrointestinal: Negative.   Genitourinary: Negative.   Musculoskeletal: Positive for falls and joint pain.  Skin: Negative.   Neurological: Negative.   Endo/Heme/Allergies: Negative.   Psychiatric/Behavioral: Negative.     There were no vitals taken for this visit. Physical Exam  Constitutional: She is oriented to person, place, and time. She appears well-developed and well-nourished.  HENT:  Head: Normocephalic and atraumatic.  Mouth/Throat: Oropharynx is clear and moist.  Eyes: EOM are normal.  Neck: Neck supple.  Cardiovascular: Normal rate.  Respiratory: Effort normal.  GI: Soft.  Genitourinary:    Genitourinary Comments: Not pertinent to current symptomatology therefore not examined.   Musculoskeletal:     Comments: Left knee 3+ effusion diffuse pain.  Pain with motion  Skin intact.  Right knee has full range of motion without pain, swelling, or deformity.  Right wrist pain and swelling.  DNVI    Neurological: She is alert and oriented to person, place, and time.  Skin: Skin is warm and dry.  Psychiatric: She has a normal mood and affect. Her behavior is normal.     Assessment Left knee displaced patella fracture Right wrist distal radius fracture   Plan Open reduction internal fixation left knee patella fracture.  The risks, benefits, and possible complications of the procedure were discussed in detail with the patient.  The patient is without question. Lauren Figueroa J Lauren Gravois, PA-C 02/13/2019, 10:58 AM

## 2019-02-13 NOTE — Progress Notes (Signed)

## 2019-02-14 LAB — NOVEL CORONAVIRUS, NAA (HOSP ORDER, SEND-OUT TO REF LAB; TAT 18-24 HRS): SARS-CoV-2, NAA: NOT DETECTED

## 2019-02-17 ENCOUNTER — Ambulatory Visit (HOSPITAL_BASED_OUTPATIENT_CLINIC_OR_DEPARTMENT_OTHER): Payer: Medicare Other | Admitting: Anesthesiology

## 2019-02-17 ENCOUNTER — Encounter (HOSPITAL_BASED_OUTPATIENT_CLINIC_OR_DEPARTMENT_OTHER): Payer: Self-pay

## 2019-02-17 ENCOUNTER — Encounter (HOSPITAL_BASED_OUTPATIENT_CLINIC_OR_DEPARTMENT_OTHER)
Admission: RE | Disposition: A | Discharge status: Home / Self Care | Payer: Self-pay | Source: Home / Self Care | Attending: Orthopedic Surgery

## 2019-02-17 ENCOUNTER — Other Ambulatory Visit: Payer: Self-pay

## 2019-02-17 ENCOUNTER — Ambulatory Visit (HOSPITAL_BASED_OUTPATIENT_CLINIC_OR_DEPARTMENT_OTHER)
Admission: RE | Admit: 2019-02-17 | Discharge: 2019-02-17 | Disposition: A | Discharge status: Home / Self Care | Payer: Medicare Other | Attending: Orthopedic Surgery | Admitting: Orthopedic Surgery

## 2019-02-17 DIAGNOSIS — Z888 Allergy status to other drugs, medicaments and biological substances status: Secondary | ICD-10-CM | POA: Insufficient documentation

## 2019-02-17 DIAGNOSIS — Z886 Allergy status to analgesic agent status: Secondary | ICD-10-CM | POA: Diagnosis not present

## 2019-02-17 DIAGNOSIS — M1612 Unilateral primary osteoarthritis, left hip: Secondary | ICD-10-CM | POA: Diagnosis not present

## 2019-02-17 DIAGNOSIS — S52501A Unspecified fracture of the lower end of right radius, initial encounter for closed fracture: Secondary | ICD-10-CM | POA: Insufficient documentation

## 2019-02-17 DIAGNOSIS — M810 Age-related osteoporosis without current pathological fracture: Secondary | ICD-10-CM | POA: Insufficient documentation

## 2019-02-17 DIAGNOSIS — Z8719 Personal history of other diseases of the digestive system: Secondary | ICD-10-CM | POA: Diagnosis not present

## 2019-02-17 DIAGNOSIS — M19041 Primary osteoarthritis, right hand: Secondary | ICD-10-CM | POA: Insufficient documentation

## 2019-02-17 DIAGNOSIS — Z880 Allergy status to penicillin: Secondary | ICD-10-CM | POA: Diagnosis not present

## 2019-02-17 DIAGNOSIS — Z87891 Personal history of nicotine dependence: Secondary | ICD-10-CM | POA: Diagnosis not present

## 2019-02-17 DIAGNOSIS — M47819 Spondylosis without myelopathy or radiculopathy, site unspecified: Secondary | ICD-10-CM | POA: Diagnosis not present

## 2019-02-17 DIAGNOSIS — M19042 Primary osteoarthritis, left hand: Secondary | ICD-10-CM | POA: Insufficient documentation

## 2019-02-17 DIAGNOSIS — S82032A Displaced transverse fracture of left patella, initial encounter for closed fracture: Secondary | ICD-10-CM | POA: Diagnosis present

## 2019-02-17 DIAGNOSIS — W1830XA Fall on same level, unspecified, initial encounter: Secondary | ICD-10-CM | POA: Diagnosis not present

## 2019-02-17 DIAGNOSIS — Z882 Allergy status to sulfonamides status: Secondary | ICD-10-CM | POA: Diagnosis not present

## 2019-02-17 HISTORY — PX: ORIF PATELLA: SHX5033

## 2019-02-17 SURGERY — OPEN REDUCTION INTERNAL FIXATION (ORIF) PATELLA
Anesthesia: General | Site: Knee | Laterality: Left

## 2019-02-17 MED ORDER — LACTATED RINGERS IV SOLN
INTRAVENOUS | Status: DC
  Administered 2019-02-17: 09:00:00 via INTRAVENOUS

## 2019-02-17 MED ORDER — VITAMIN D 1000 UNITS PO TABS: 5000.0000 [IU] | ORAL_TABLET | Freq: Every day | ORAL | 3 refills | Status: AC

## 2019-02-17 MED ORDER — ACETAMINOPHEN 160 MG/5ML PO SOLN: 325.0000 mg | ORAL | Status: DC | PRN

## 2019-02-17 MED ORDER — OXYCODONE HCL 5 MG/5ML PO SOLN: 5.0000 mg | Freq: Once | ORAL | Status: DC | PRN

## 2019-02-17 MED ORDER — ONDANSETRON HCL 4 MG/2ML IJ SOLN: 4.0000 mg | Freq: Once | INTRAMUSCULAR | Status: DC | PRN

## 2019-02-17 MED ORDER — FENTANYL CITRATE (PF) 100 MCG/2ML IJ SOLN
50.0000 ug | INTRAMUSCULAR | Status: DC | PRN
  Administered 2019-02-17: 09:00:00 50 ug via INTRAVENOUS

## 2019-02-17 MED ORDER — CLONIDINE HCL (ANALGESIA) 100 MCG/ML EP SOLN
EPIDURAL | Status: DC | PRN
  Administered 2019-02-17: 100 ug

## 2019-02-17 MED ORDER — FENTANYL CITRATE (PF) 100 MCG/2ML IJ SOLN
INTRAMUSCULAR | Status: AC
  Filled 2019-02-17: qty 2

## 2019-02-17 MED ORDER — ROPIVACAINE HCL 7.5 MG/ML IJ SOLN
INTRAMUSCULAR | Status: DC | PRN
  Administered 2019-02-17: 30 mL via PERINEURAL

## 2019-02-17 MED ORDER — LACTATED RINGERS IV SOLN: INTRAVENOUS | Status: DC

## 2019-02-17 MED ORDER — DEXAMETHASONE SODIUM PHOSPHATE 10 MG/ML IJ SOLN
INTRAMUSCULAR | Status: DC | PRN
  Administered 2019-02-17: 10 mg via INTRAVENOUS

## 2019-02-17 MED ORDER — VANCOMYCIN HCL IN DEXTROSE 1-5 GM/200ML-% IV SOLN
1000.0000 mg | INTRAVENOUS | Status: AC
  Administered 2019-02-17: 10:00:00 1000 mg via INTRAVENOUS

## 2019-02-17 MED ORDER — POVIDONE-IODINE 10 % EX SWAB: 2.0000 "application " | Freq: Once | CUTANEOUS | Status: DC

## 2019-02-17 MED ORDER — OXYCODONE HCL 5 MG PO TABS: 5.0000 mg | ORAL_TABLET | ORAL | 0 refills | Status: AC | PRN

## 2019-02-17 MED ORDER — MIDAZOLAM HCL 2 MG/2ML IJ SOLN
1.0000 mg | INTRAMUSCULAR | Status: DC | PRN
  Administered 2019-02-17: 2 mg via INTRAVENOUS

## 2019-02-17 MED ORDER — VANCOMYCIN HCL IN DEXTROSE 1-5 GM/200ML-% IV SOLN
INTRAVENOUS | Status: AC
  Filled 2019-02-17: qty 200

## 2019-02-17 MED ORDER — OXYCODONE HCL 5 MG PO TABS: 5.0000 mg | ORAL_TABLET | Freq: Once | ORAL | Status: DC | PRN

## 2019-02-17 MED ORDER — PROPOFOL 10 MG/ML IV BOLUS
INTRAVENOUS | Status: DC | PRN
  Administered 2019-02-17: 100 mg via INTRAVENOUS

## 2019-02-17 MED ORDER — MIDAZOLAM HCL 2 MG/2ML IJ SOLN
INTRAMUSCULAR | Status: AC
  Filled 2019-02-17: qty 2

## 2019-02-17 MED ORDER — FENTANYL CITRATE (PF) 100 MCG/2ML IJ SOLN
25.0000 ug | INTRAMUSCULAR | Status: DC | PRN
  Administered 2019-02-17 (×2): 25 ug via INTRAVENOUS

## 2019-02-17 MED ORDER — LIDOCAINE 2% (20 MG/ML) 5 ML SYRINGE
INTRAMUSCULAR | Status: AC
  Filled 2019-02-17: qty 5

## 2019-02-17 MED ORDER — CHLORHEXIDINE GLUCONATE 4 % EX LIQD: 60.0000 mL | Freq: Once | CUTANEOUS | Status: DC

## 2019-02-17 MED ORDER — DEXAMETHASONE SODIUM PHOSPHATE 10 MG/ML IJ SOLN
INTRAMUSCULAR | Status: AC
  Filled 2019-02-17: qty 1

## 2019-02-17 MED ORDER — ONDANSETRON HCL 4 MG/2ML IJ SOLN
INTRAMUSCULAR | Status: AC
  Filled 2019-02-17: qty 2

## 2019-02-17 MED ORDER — ACETAMINOPHEN 325 MG PO TABS: 325.0000 mg | ORAL_TABLET | ORAL | Status: DC | PRN

## 2019-02-17 MED ORDER — POVIDONE-IODINE 7.5 % EX SOLN: Freq: Once | CUTANEOUS | Status: DC

## 2019-02-17 MED ORDER — PROPOFOL 10 MG/ML IV BOLUS
INTRAVENOUS | Status: AC
  Filled 2019-02-17: qty 20

## 2019-02-17 MED ORDER — ONDANSETRON HCL 4 MG/2ML IJ SOLN
INTRAMUSCULAR | Status: DC | PRN
  Administered 2019-02-17: 4 mg via INTRAVENOUS

## 2019-02-17 MED ORDER — MEPERIDINE HCL 25 MG/ML IJ SOLN: 6.2500 mg | INTRAMUSCULAR | Status: DC | PRN

## 2019-02-17 SURGICAL SUPPLY — 81 items
BAG DECANTER FOR FLEXI CONT (MISCELLANEOUS) IMPLANT
BANDAGE ESMARK 6X9 LF (GAUZE/BANDAGES/DRESSINGS) IMPLANT
BENZOIN TINCTURE PRP APPL 2/3 (GAUZE/BANDAGES/DRESSINGS) ×1 IMPLANT
BIT DRILL 2.6 CANN (BIT) ×2 IMPLANT
BLADE HEX COATED 2.75 (ELECTRODE) ×3 IMPLANT
BLADE SURG 15 STRL LF DISP TIS (BLADE) ×1 IMPLANT
BLADE SURG 15 STRL SS (BLADE) ×8
BNDG ELASTIC 4X5.8 VLCR STR LF (GAUZE/BANDAGES/DRESSINGS) ×3 IMPLANT
BNDG ELASTIC 6X5.8 VLCR STR LF (GAUZE/BANDAGES/DRESSINGS) ×3 IMPLANT
BNDG ESMARK 6X9 LF (GAUZE/BANDAGES/DRESSINGS) ×3
BNDG GAUZE ELAST 4 BULKY (GAUZE/BANDAGES/DRESSINGS) IMPLANT
CANISTER SUCT 1200ML W/VALVE (MISCELLANEOUS) ×2 IMPLANT
CLOSURE WOUND 1/2 X4 (GAUZE/BANDAGES/DRESSINGS) ×1
COVER WAND RF STERILE (DRAPES) IMPLANT
CUFF TOURN SGL QUICK 34 (TOURNIQUET CUFF) ×2
CUFF TRNQT CYL 34X4.125X (TOURNIQUET CUFF) IMPLANT
DRAPE EXTREMITY T 121X128X90 (DISPOSABLE) ×3 IMPLANT
DRAPE OEC MINIVIEW 54X84 (DRAPES) ×3 IMPLANT
DRAPE U-SHAPE 47X51 STRL (DRAPES) ×3 IMPLANT
DRSG AQUACEL AG ADV 3.5X 6 (GAUZE/BANDAGES/DRESSINGS) ×2 IMPLANT
DRSG PAD ABDOMINAL 8X10 ST (GAUZE/BANDAGES/DRESSINGS) ×5 IMPLANT
DURAPREP 26ML APPLICATOR (WOUND CARE) ×3 IMPLANT
ELECT REM PT RETURN 9FT ADLT (ELECTROSURGICAL) ×3
ELECTRODE REM PT RTRN 9FT ADLT (ELECTROSURGICAL) ×1 IMPLANT
FIBERTAPE 2 W/STRL NDL 17 (SUTURE) ×2 IMPLANT
GAUZE 4X4 16PLY RFD (DISPOSABLE) IMPLANT
GAUZE SPONGE 4X4 12PLY STRL (GAUZE/BANDAGES/DRESSINGS) ×3 IMPLANT
GAUZE XEROFORM 1X8 LF (GAUZE/BANDAGES/DRESSINGS) IMPLANT
GLOVE BIO SURGEON STRL SZ 6.5 (GLOVE) ×1 IMPLANT
GLOVE BIO SURGEON STRL SZ7 (GLOVE) ×3 IMPLANT
GLOVE BIO SURGEONS STRL SZ 6.5 (GLOVE) ×1
GLOVE BIOGEL PI IND STRL 7.0 (GLOVE) ×1 IMPLANT
GLOVE BIOGEL PI IND STRL 7.5 (GLOVE) ×1 IMPLANT
GLOVE BIOGEL PI INDICATOR 7.0 (GLOVE) ×6
GLOVE BIOGEL PI INDICATOR 7.5 (GLOVE) ×2
GLOVE SS BIOGEL STRL SZ 7.5 (GLOVE) ×1 IMPLANT
GLOVE SUPERSENSE BIOGEL SZ 7.5 (GLOVE) ×2
GOWN STRL REUS W/ TWL LRG LVL3 (GOWN DISPOSABLE) ×2 IMPLANT
GOWN STRL REUS W/ TWL XL LVL3 (GOWN DISPOSABLE) ×1 IMPLANT
GOWN STRL REUS W/TWL LRG LVL3 (GOWN DISPOSABLE) ×4
GOWN STRL REUS W/TWL XL LVL3 (GOWN DISPOSABLE) ×2
GUIDEWIRE 1.35MM  DUAL TROCAR (WIRE) ×6
GUIDEWIRE 1.35MM DUAL TROCAR (WIRE) IMPLANT
IMMOBILIZER KNEE 22 UNIV (SOFTGOODS) IMPLANT
IMMOBILIZER KNEE 24 THIGH 36 (MISCELLANEOUS) IMPLANT
IMMOBILIZER KNEE 24 UNIV (MISCELLANEOUS)
NDL HYPO 25X1 1.5 SAFETY (NEEDLE) IMPLANT
NDL MAYO TROCAR (NEEDLE) IMPLANT
NEEDLE HYPO 25X1 1.5 SAFETY (NEEDLE) IMPLANT
NEEDLE MAYO TROCAR (NEEDLE) IMPLANT
PACK ARTHROSCOPY DSU (CUSTOM PROCEDURE TRAY) ×3 IMPLANT
PACK BASIN DAY SURGERY FS (CUSTOM PROCEDURE TRAY) ×3 IMPLANT
PAD CAST 4YDX4 CTTN HI CHSV (CAST SUPPLIES) ×1 IMPLANT
PADDING CAST COTTON 4X4 STRL (CAST SUPPLIES) ×2
PENCIL BUTTON HOLSTER BLD 10FT (ELECTRODE) ×3 IMPLANT
SCREW CANN BLUNT TIP 4X38 LP (Screw) ×2 IMPLANT
SCREW CANN BLUNT TIP LP 4X28 (Screw) ×2 IMPLANT
SCREW CANN LP BT 4X44 (Screw) ×2 IMPLANT
SCREW LO-PRO BLUNT TIP 4X40MM (Screw) ×2 IMPLANT
SLEEVE SCD COMPRESS KNEE MED (MISCELLANEOUS) IMPLANT
SPONGE LAP 4X18 RFD (DISPOSABLE) ×3 IMPLANT
STAPLER VISISTAT 35W (STAPLE) IMPLANT
STRIP CLOSURE SKIN 1/2X4 (GAUZE/BANDAGES/DRESSINGS) ×1 IMPLANT
SUCTION FRAZIER HANDLE 10FR (MISCELLANEOUS) ×2
SUCTION TUBE FRAZIER 10FR DISP (MISCELLANEOUS) ×1 IMPLANT
SUT ETHILON 4 0 PS 2 18 (SUTURE) IMPLANT
SUT MNCRL AB 3-0 PS2 18 (SUTURE) ×3 IMPLANT
SUT PROLENE 3 0 PS 2 (SUTURE) ×3 IMPLANT
SUT VIC AB 0 CT1 27 (SUTURE)
SUT VIC AB 0 CT1 27XBRD ANBCTR (SUTURE) IMPLANT
SUT VIC AB 2-0 SH 27 (SUTURE) ×4
SUT VIC AB 2-0 SH 27XBRD (SUTURE) IMPLANT
SUT VIC AB 3-0 FS2 27 (SUTURE) ×2 IMPLANT
SUT VICRYL 4-0 PS2 18IN ABS (SUTURE) IMPLANT
SYR BULB 3OZ (MISCELLANEOUS) ×3 IMPLANT
TOWEL GREEN STERILE FF (TOWEL DISPOSABLE) ×6 IMPLANT
UNDERPAD 30X36 HEAVY ABSORB (UNDERPADS AND DIAPERS) ×3 IMPLANT
WASHER (Orthopedic Implant) ×2 IMPLANT
WASHER ORTHO 7X (Orthopedic Implant) IMPLANT
YANKAUER SUCT BULB TIP NO VENT (SUCTIONS) ×3 IMPLANT
fibertape 2mm ×2 IMPLANT

## 2019-02-17 NOTE — Discharge Instructions (Signed)

## 2019-02-17 NOTE — Progress Notes (Signed)
Assisted Dr. Ambrose Pancoast with left, ultrasound guided, adductor canal block. Side rails up, monitors on throughout procedure. See vital signs in flow sheet. Tolerated Procedure well.

## 2019-02-17 NOTE — Op Note (Signed)
NAME: Lauren Figueroa, FRESE MEDICAL RECORD [id number] ACCOUNT [id number] DATE OF BIRTH:23-Nov-1950 FACILITY: [facility] LOCATION: [facility] PHYSICIAN:Demetri Kerman Venetia Maxon, MD  OPERATIVE REPORT  DATE OF PROCEDURE:  02/17/2019  PREOPERATIVE DIAGNOSES:  Left knee acute traumatic displaced patellar fracture.  POSTOPERATIVE DIAGNOSES:  Left knee acute traumatic displaced patellar fracture.  PROCEDURE:  Open reduction internal fixation of left patellar fracture.  SURGEON:  Elsie Saas, MD  ASSISTANT:  Matthew Saras, PA-C  ANESTHESIA:  General.  OPERATIVE TIME:  One hour.  COMPLICATIONS:  None.  INDICATION FOR PROCEDURE:  The patient is a 68 year old who sustained a fall with a displaced left patellar fracture 5 days ago.  X-rays have confirmed this, and she is now to undergo open reduction internal fixation of this.  DESCRIPTION OF PROCEDURE:  The patient was brought to the operating room on 02/17/2019 after an adductor block and then placed in the holding room by anesthesia.  She was placed on the operating table in supine position.  She received antibiotics  preoperatively for prophylaxis.  After being placed under general anesthesia, her left knee was examined.  Range of motion 0-90 degrees.  Knee was stable with patellar fracture noted.  Left leg was prepped using sterile DuraPrep and draped using sterile  technique.  Time-out procedure was called and the correct left knee identified.  Left leg was exsanguinated and a tourniquet elevated to 300 mm.  Initially through a 5 cm longitudinal incision based over the patella, initial exposure was made.   Underlying subcutaneous tissues were incised in line with the skin incision.  The prepatellar bursa was incised, and the fascia over the patellar fracture was incised longitudinally.  She was found to have a transverse midpatellar fracture with moderate  displacement.  Using a large tenaculum, the fracture was reduced anatomically and  confirmed with intraoperative fluoroscopy.  After this was done, then using the Arthrex cannulated 4.0 screws, 2 parallel guide pins were drilled from proximal to distal  under fluoroscopic control while the fracture was held in reduced position.  Each of these was measured for the appropriate length.  One was 44 mm and one 40 mm.  Each of these was then overdrilled with the appropriate cannulated drill, and then both the  40 and 44 mm screws were sequentially screwed into position with excellent tight fixation.  After this was done, then using an Arthrex FiberWire, this was placed through the cannulated screws in a figure-of-eight technique and then tied down, thus  further securing the fracture anatomically.  Intraoperative x-rays and fluoroscopy confirmed anatomic reduction and satisfactory internal fixation.  At this point, I felt that all pathology had been satisfactorily addressed.  The instruments were  removed.  The longitudinal incisions and the quadriceps tendon to place the screws and the patellar tendon to place the screws were then sequentially repaired with 2-0 Vicryl.  The fascia over the patella was closed with 2-0 Vicryl, subcutaneous tissues  were closed with 2-0 Vicryl, subcuticular layer closed with 4-0 Monocryl.  Sterile dressings were applied and a long leg splint and the patient awakened and taken to recovery room in stable condition.  Needle and sponge count was correct x2 at the end of  the case.  FOLLOWUP CARE:  The patient will be followed as an outpatient, on oxycodone for pain.  See back in the office in a week for wound check and followup.  LN/NUANCE  D:02/17/2019 T:02/17/2019 JOB:[id number]

## 2019-02-17 NOTE — Anesthesia Postprocedure Evaluation (Signed)
Anesthesia Post Note  Patient: Lauren Figueroa  Procedure(s) Performed: OPEN REDUCTION INTERNAL (ORIF) FIXATION PATELLA (Left Knee)     Patient location during evaluation: PACU Anesthesia Type: General Level of consciousness: awake and alert Pain management: pain level controlled Vital Signs Assessment: post-procedure vital signs reviewed and stable Respiratory status: spontaneous breathing, nonlabored ventilation, respiratory function stable and patient connected to nasal cannula oxygen Cardiovascular status: blood pressure returned to baseline and stable Postop Assessment: no apparent nausea or vomiting Anesthetic complications: no    Last Vitals:  Vitals:   02/17/19 1230 02/17/19 1317  BP: 115/78 130/64  Pulse: (!) 58 (!) 58  Resp: 17 18  Temp:  36.8 C  SpO2: 100% 97%    Last Pain:  Vitals:   02/17/19 1317  TempSrc:   PainSc: 3                  Shalanda Brogden

## 2019-02-17 NOTE — Anesthesia Procedure Notes (Signed)
Procedure Name: LMA Insertion Performed by: Verita Lamb, CRNA Pre-anesthesia Checklist: Patient identified, Emergency Drugs available, Suction available and Patient being monitored Patient Re-evaluated:Patient Re-evaluated prior to induction Oxygen Delivery Method: Circle system utilized Preoxygenation: Pre-oxygenation with 100% oxygen Induction Type: IV induction Ventilation: Mask ventilation without difficulty LMA: LMA inserted LMA Size: 4.0 Number of attempts: 1 Airway Equipment and Method: Bite block Placement Confirmation: positive ETCO2 Tube secured with: Tape Dental Injury: Teeth and Oropharynx as per pre-operative assessment

## 2019-02-17 NOTE — Anesthesia Preprocedure Evaluation (Signed)
Anesthesia Evaluation  Patient identified by MRN, date of birth, ID band Patient awake    Reviewed: Allergy & Precautions, NPO status , Patient's Chart, lab work & pertinent test results  Airway Mallampati: I       Dental no notable dental hx. (+) Teeth Intact   Pulmonary former smoker,    Pulmonary exam normal breath sounds clear to auscultation       Cardiovascular negative cardio ROS Normal cardiovascular exam Rhythm:Regular Rate:Normal     Neuro/Psych negative neurological ROS  negative psych ROS   GI/Hepatic Neg liver ROS,   Endo/Other  negative endocrine ROS  Renal/GU negative Renal ROS     Musculoskeletal   Abdominal Normal abdominal exam  (+)   Peds  Hematology  (+) Blood dyscrasia, anemia ,   Anesthesia Other Findings   Reproductive/Obstetrics                             Anesthesia Physical  Anesthesia Plan  ASA: II  Anesthesia Plan: General   Post-op Pain Management:    Induction: Intravenous  PONV Risk Score and Plan: 3 and Ondansetron, Dexamethasone, Treatment may vary due to age or medical condition and Midazolam  Airway Management Planned: Oral ETT and LMA  Additional Equipment:   Intra-op Plan:   Post-operative Plan:   Informed Consent: I have reviewed the patients History and Physical, chart, labs and discussed the procedure including the risks, benefits and alternatives for the proposed anesthesia with the patient or authorized representative who has indicated his/her understanding and acceptance.     Dental advisory given  Plan Discussed with: CRNA, Anesthesiologist and Surgeon  Anesthesia Plan Comments: ( )        Anesthesia Quick Evaluation

## 2019-02-17 NOTE — Interval H&P Note (Signed)
History and Physical Interval Note:  02/17/2019 9:01 AM  Lauren Figueroa  has presented today for surgery, with the diagnosis of left patella fracture.  The various methods of treatment have been discussed with the patient and family. After consideration of risks, benefits and other options for treatment, the patient has consented to  Procedure(s): OPEN REDUCTION INTERNAL (ORIF) FIXATION PATELLA (Left) as a surgical intervention.  The patient's history has been reviewed, patient examined, no change in status, stable for surgery.  I have reviewed the patient's chart and labs.  Questions were answered to the patient's satisfaction.     Lorn Junes

## 2019-02-17 NOTE — Anesthesia Procedure Notes (Signed)
Anesthesia Regional Block: Adductor canal block   Pre-Anesthetic Checklist: ,, timeout performed, Correct Patient, Correct Site, Correct Laterality, Correct Procedure, Correct Position, site marked, Risks and benefits discussed,  Surgical consent,  Pre-op evaluation,  At surgeon's request and post-op pain management  Laterality: Left  Prep: chloraprep       Needles:  Injection technique: Single-shot  Needle Type: Echogenic Stimulator Needle     Needle Length: 5cm  Needle Gauge: 22     Additional Needles:   Procedures:, nerve stimulator,,, ultrasound used (permanent image in chart),,,,  Narrative:  Start time: 02/17/2019 9:30 AM End time: 02/17/2019 9:35 AM Injection made incrementally with aspirations every 5 mL.  Performed by: Personally  Anesthesiologist: Janeece Riggers, MD  Additional Notes: Functioning IV was confirmed and monitors were applied.  A 27m 22ga Arrow echogenic stimulator needle was used. Sterile prep and drape,hand hygiene and sterile gloves were used. Ultrasound guidance: relevant anatomy identified, needle position confirmed, local anesthetic spread visualized around nerve(s)., vascular puncture avoided.  Image printed for medical record. Negative aspiration and negative test dose prior to incremental administration of local anesthetic. The patient tolerated the procedure well.

## 2019-02-17 NOTE — Transfer of Care (Signed)
Immediate Anesthesia Transfer of Care Note  Patient: Lauren Figueroa  Procedure(s) Performed: OPEN REDUCTION INTERNAL (ORIF) FIXATION PATELLA (Left Knee)  Patient Location: PACU  Anesthesia Type:General and Regional  Level of Consciousness: awake, alert  and oriented  Airway & Oxygen Therapy: Patient Spontanous Breathing and Patient connected to face mask oxygen  Post-op Assessment: Report given to RN and Post -op Vital signs reviewed and stable  Post vital signs: Reviewed and stable  Last Vitals:  Vitals Value Taken Time  BP    Temp    Pulse    Resp    SpO2      Last Pain:  Vitals:   02/17/19 0851  TempSrc: Oral  PainSc: 1          Complications: No apparent anesthesia complications

## 2019-02-24 ENCOUNTER — Encounter (HOSPITAL_BASED_OUTPATIENT_CLINIC_OR_DEPARTMENT_OTHER): Payer: Self-pay | Admitting: Orthopedic Surgery

## 2019-03-24 ENCOUNTER — Other Ambulatory Visit: Payer: Self-pay

## 2019-03-24 ENCOUNTER — Other Ambulatory Visit (HOSPITAL_COMMUNITY): Payer: Self-pay | Admitting: Orthopedic Surgery

## 2019-03-24 ENCOUNTER — Ambulatory Visit (HOSPITAL_COMMUNITY)
Admission: RE | Admit: 2019-03-24 | Discharge: 2019-03-24 | Disposition: A | Discharge status: Home / Self Care | Payer: Medicare Other | Source: Ambulatory Visit | Attending: Orthopedic Surgery | Admitting: Orthopedic Surgery

## 2019-03-24 DIAGNOSIS — M7989 Other specified soft tissue disorders: Secondary | ICD-10-CM

## 2019-03-24 DIAGNOSIS — M79605 Pain in left leg: Secondary | ICD-10-CM | POA: Insufficient documentation

## 2019-03-24 NOTE — Progress Notes (Signed)
LLE venous duplex       has been completed. Preliminary results can be found under CV proc through chart review. June Leap, BS, RDMS, RVT    Called results to Dr. Noemi Chapel

## 2020-01-13 ENCOUNTER — Ambulatory Visit: Payer: Medicare Other | Attending: Critical Care Medicine

## 2020-01-13 DIAGNOSIS — Z23 Encounter for immunization: Secondary | ICD-10-CM

## 2020-01-13 NOTE — Progress Notes (Signed)
   Covid-19 Vaccination Clinic  Name:  Lauren Figueroa    MRN: [id number] DOB: Jan 18, 1951  01/13/2020  [name] was observed post Covid-19 immunization for 15 minutes without incident. She was provided with Vaccine Information Sheet and instruction to access the V-Safe system.   [name] was instructed to call 911 with any severe reactions post vaccine: Marland Kitchen Difficulty breathing  . Swelling of face and throat  . A fast heartbeat  . A bad rash all over body  . Dizziness and weakness

## 2020-03-22 ENCOUNTER — Telehealth: Payer: Self-pay | Admitting: Internal Medicine

## 2020-03-22 NOTE — Telephone Encounter (Signed)
Patient indicates that she was told her ostomy supplies were not approved by [place] from her ostomy supplier. I advised that we are not the one that provides these; rather, Dr Sheryn Bison is the one that typically provides the authorization for these. There is actually a note in the system indicating that Dr Harolyn Rutherford office has been contacted about this. I advised that I am glad to ask Dr Hilarie Fredrickson about giving a temporary approval, but she has not seen [place] in 1.5 years so she would likely need an office visit with [place] as well. Patient is advised she may need to call Dr March Rummage office for advice first and then contact us should she still need supplies after speaking with them.

## 2020-03-22 NOTE — Telephone Encounter (Signed)
Pt is requesting a call back from a nurse to discuss some Ostomy products that she needs to have approved, pt would like to discuss further with a nurse.

## 2020-03-30 ENCOUNTER — Telehealth: Payer: Self-pay | Admitting: Internal Medicine

## 2020-03-31 NOTE — Telephone Encounter (Signed)
I have a note to Dr Hilarie Fredrickson on his desk. Patient has not been seen in 1.5 years by [place] for her crohns. Her surgeon was originally providing these supplies but has now declined to provide them any longer as she has not seen them in so long. I am unsure whether he will sign for these supplies without a scheduled office visit.

## 2020-03-31 NOTE — Telephone Encounter (Signed)
Lauren Figueroa I saw a note where you have been talking with this pt regarding her ostomy supplies.

## 2020-06-29 ENCOUNTER — Encounter: Payer: Self-pay | Admitting: Internal Medicine

## 2020-06-30 ENCOUNTER — Telehealth: Payer: Self-pay | Admitting: *Deleted

## 2020-06-30 NOTE — Telephone Encounter (Signed)
We have received faxed request to sign patient's ostomy supply orders from [place]. Dr Hilarie Fredrickson indicates that he is okay with signing these orders. Patient does not need an appointment with [place] unless she begins having issues with her ostomy. She no longer has a colon so no need for colonoscopy.
